# Patient Record
Sex: Female | Born: 1985 | Hispanic: No | Marital: Married | Smoking: Never smoker
Health system: Southern US, Community
[De-identification: ages and names within clinical notes are randomized; demographics above are authoritative.]

## PROBLEM LIST (undated history)

## (undated) DIAGNOSIS — Z789 Other specified health status: Secondary | ICD-10-CM

## (undated) DIAGNOSIS — O321XX0 Maternal care for breech presentation, not applicable or unspecified: Secondary | ICD-10-CM

## (undated) DIAGNOSIS — O320XX0 Maternal care for unstable lie, not applicable or unspecified: Secondary | ICD-10-CM

## (undated) DIAGNOSIS — J309 Allergic rhinitis, unspecified: Secondary | ICD-10-CM

## (undated) DIAGNOSIS — Z973 Presence of spectacles and contact lenses: Secondary | ICD-10-CM

## (undated) HISTORY — PX: TONSILLECTOMY: SUR1361

## (undated) HISTORY — PX: MOUTH SURGERY: SHX715

---

## 2010-12-08 ENCOUNTER — Other Ambulatory Visit (HOSPITAL_COMMUNITY): Payer: Self-pay | Admitting: Obstetrics and Gynecology

## 2010-12-08 DIAGNOSIS — O2690 Pregnancy related conditions, unspecified, unspecified trimester: Secondary | ICD-10-CM

## 2010-12-11 ENCOUNTER — Other Ambulatory Visit (HOSPITAL_COMMUNITY): Payer: Self-pay

## 2010-12-11 ENCOUNTER — Other Ambulatory Visit (HOSPITAL_COMMUNITY): Payer: Self-pay | Admitting: Obstetrics and Gynecology

## 2010-12-11 ENCOUNTER — Encounter (HOSPITAL_COMMUNITY): Payer: Self-pay

## 2010-12-11 ENCOUNTER — Ambulatory Visit (HOSPITAL_COMMUNITY)
Admission: RE | Admit: 2010-12-11 | Discharge: 2010-12-11 | Disposition: A | Discharge status: Home / Self Care | Payer: Commercial Managed Care - PPO | Source: Ambulatory Visit | Attending: Obstetrics and Gynecology | Admitting: Obstetrics and Gynecology

## 2010-12-11 DIAGNOSIS — O368390 Maternal care for abnormalities of the fetal heart rate or rhythm, unspecified trimester, not applicable or unspecified: Secondary | ICD-10-CM

## 2010-12-11 DIAGNOSIS — Z3689 Encounter for other specified antenatal screening: Secondary | ICD-10-CM | POA: Insufficient documentation

## 2010-12-11 DIAGNOSIS — O2690 Pregnancy related conditions, unspecified, unspecified trimester: Secondary | ICD-10-CM

## 2011-01-06 ENCOUNTER — Inpatient Hospital Stay (HOSPITAL_COMMUNITY): Admission: AD | Admit: 2011-01-06 | Payer: Self-pay | Source: Home / Self Care | Admitting: Family Medicine

## 2011-01-08 ENCOUNTER — Ambulatory Visit (HOSPITAL_COMMUNITY): Payer: Commercial Managed Care - PPO

## 2011-03-01 ENCOUNTER — Inpatient Hospital Stay (HOSPITAL_COMMUNITY)
Admission: AD | Admit: 2011-03-01 | Discharge: 2011-03-04 | DRG: 775 | Disposition: A | Discharge status: Home / Self Care | Payer: Commercial Managed Care - PPO | Source: Ambulatory Visit | Attending: Obstetrics and Gynecology | Admitting: Obstetrics and Gynecology

## 2011-03-01 DIAGNOSIS — O409XX0 Polyhydramnios, unspecified trimester, not applicable or unspecified: Principal | ICD-10-CM | POA: Diagnosis present

## 2011-03-01 DIAGNOSIS — D62 Acute posthemorrhagic anemia: Secondary | ICD-10-CM | POA: Diagnosis not present

## 2011-03-01 DIAGNOSIS — O9903 Anemia complicating the puerperium: Secondary | ICD-10-CM | POA: Diagnosis not present

## 2011-03-01 LAB — CBC
HCT: 34.4 % — ABNORMAL LOW (ref 36.0–46.0)
Hemoglobin: 11.5 g/dL — ABNORMAL LOW (ref 12.0–15.0)
MCH: 29.8 pg (ref 26.0–34.0)
MCHC: 33.4 g/dL (ref 30.0–36.0)
MCV: 89.1 fL (ref 78.0–100.0)

## 2011-03-03 LAB — CBC
HCT: 29.2 % — ABNORMAL LOW (ref 36.0–46.0)
MCHC: 33.6 g/dL (ref 30.0–36.0)
MCV: 89 fL (ref 78.0–100.0)
RDW: 14.1 % (ref 11.5–15.5)
WBC: 13.9 10*3/uL — ABNORMAL HIGH (ref 4.0–10.5)

## 2011-03-13 NOTE — H&P (Signed)
  NAMEERIAL, FIKES NO.:  [id number]  MEDICAL RECORD NO.:  [id number]  LOCATION:  9132                          FACILITY:  [facility]  PHYSICIAN:  Lenoard Aden, M.D.DATE OF BIRTH:  Jan 10, 1986  DATE OF ADMISSION:  03/01/2011 DATE OF DISCHARGE:                             HISTORY & PHYSICAL   CHIEF COMPLAINT:  Polyhydramnios, attempt for induction.  She is a 25 year old white female G1, P0 at 39 weeks for cervical ripening and induction due to persistent polyhydramnios.  She is a nonsmoker, nondrinker.  She denies domestic or physical violence.  MEDICATIONS:  Include prenatal vitamins.  She had noncontributory social history or pregnancy history.  She has a family history which is also noncontributory.  PRENATAL COURSE:  Complicated by an abnormal ultrasound at 27 weeks which revealed a fetal arrhythmia.  Maternal fetal ultrasound was ordered and persistent polyhydramnios was noted with AFIs ranging from 30 to 32 with weekly ample surveillance.  Recommendation was made for delivery at 39 weeks.  PHYSICAL EXAMINATION:  GENERAL:  She is a well-developed, well-nourished white female, in no acute distress. HEENT:  Normal. NECK:  Supple.  Full range of motion. LUNGS:  Clear. HEART:  Regular rhythm. ABDOMEN:  Soft, gravid, and nontender.  Estimated fetal weight 8 pounds. Cervix is 2-3 cm, 70% vertex, -1. EXTREMITIES:  There are no cords. NEUROLOGIC:  Nonfocal. SKIN:  Intact.  IMPRESSION: 1. A 39-week intrauterine pregnancy. 2. Persistent mild-to-moderate polyhydramnios.  PLAN:  Proceed with induction.  Risks, benefits discussed.     Lenoard Aden, M.D.     RJT/MEDQ  D:  03/02/2011  T:  03/02/2011  Job:  [id number]  Electronically Signed by Olivia Mackie M.D. on 03/13/2011 07:36:25 AM

## 2011-06-23 ENCOUNTER — Encounter (HOSPITAL_COMMUNITY): Payer: Self-pay | Admitting: *Deleted

## 2012-09-24 IMAGING — US US OB DETAIL+14 WK
1 series · 14 of 28 positions shown · non-contrast
Comparison: none

[Series 1: us ob detail+14 wk · 0.23mm/px · 14 of 52 slices shown]
[im 2/52]
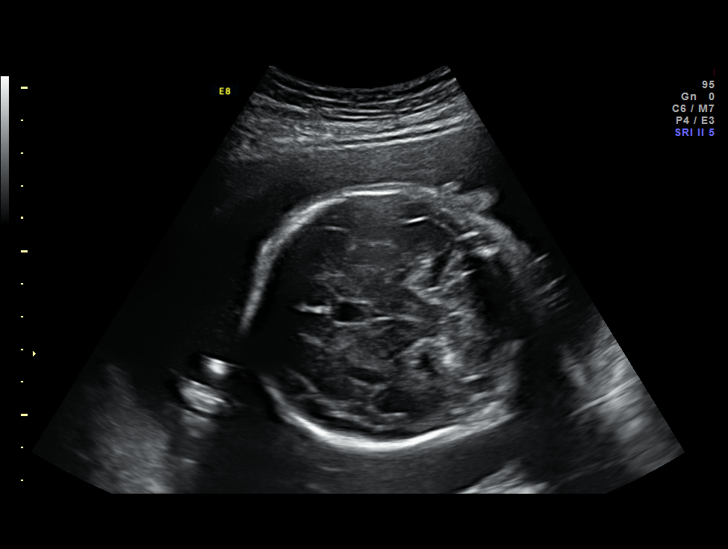
[im 6/52]
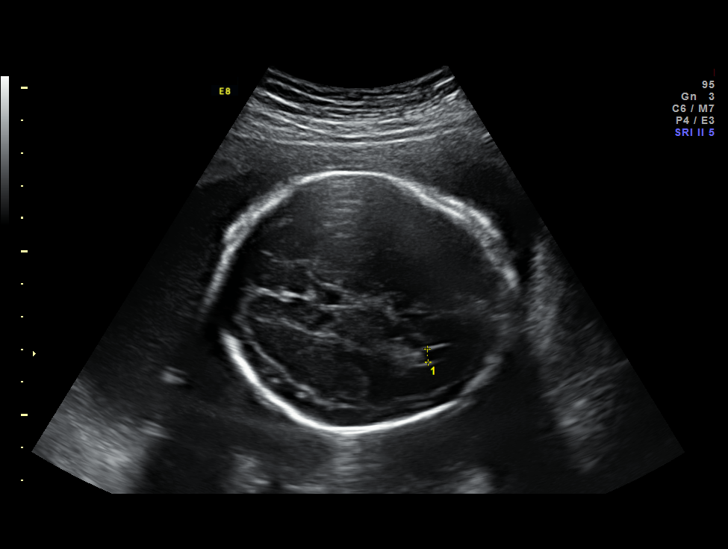
[im 10/52]
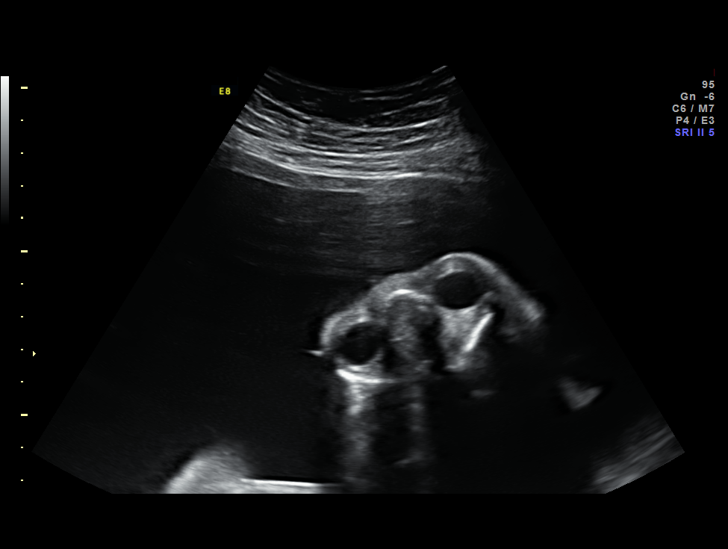
[im 14/52]
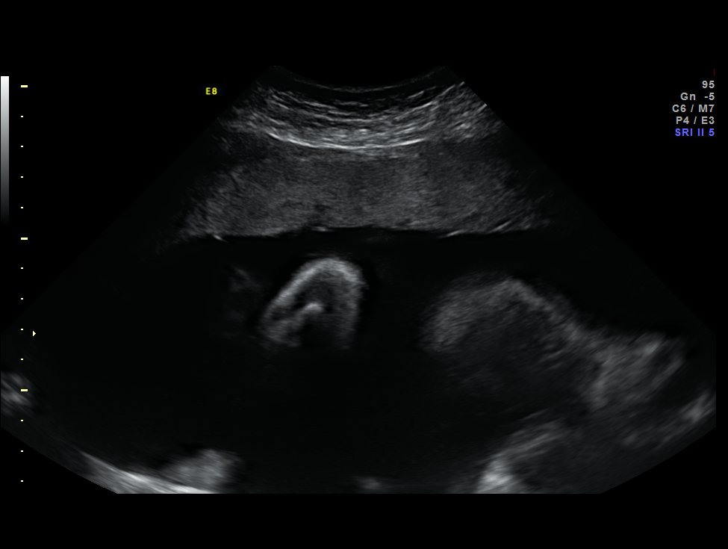
[im 18/52]
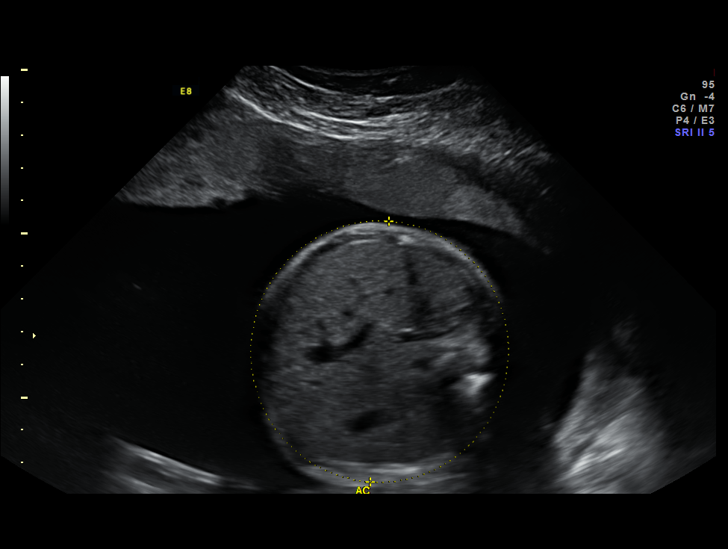
[im 21/52]
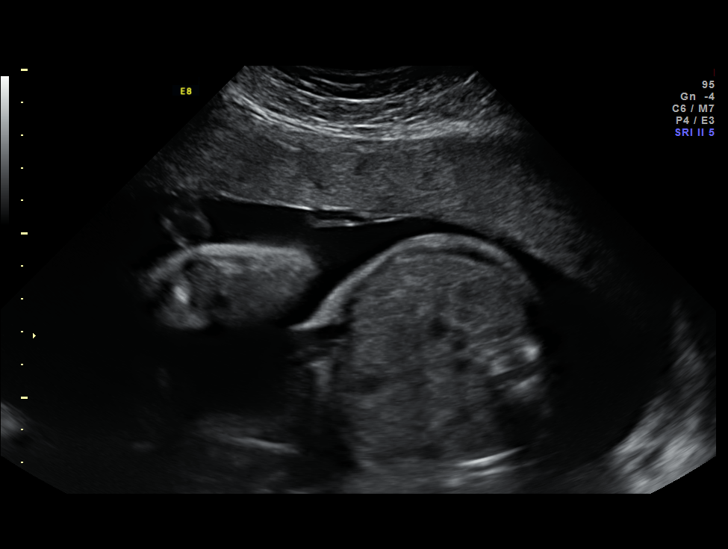
[im 25/52]
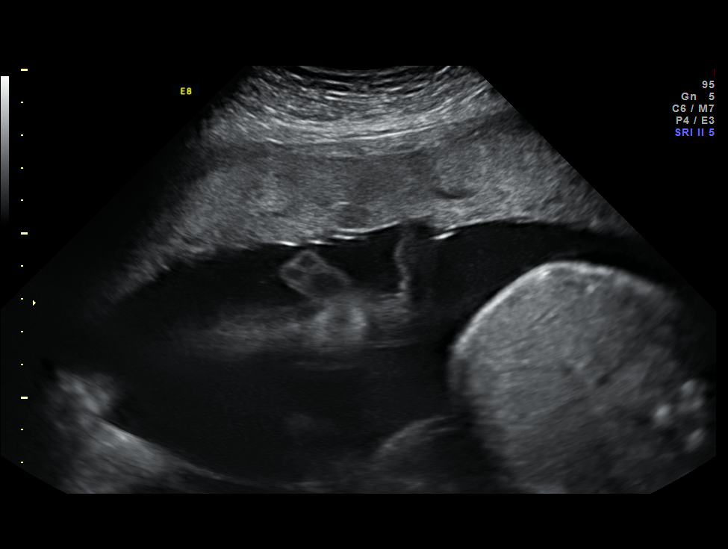
[im 29/52]
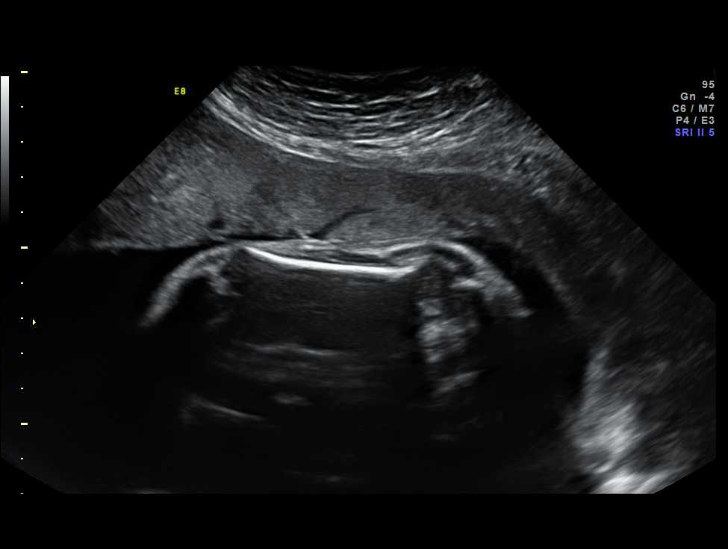
[im 33/52]
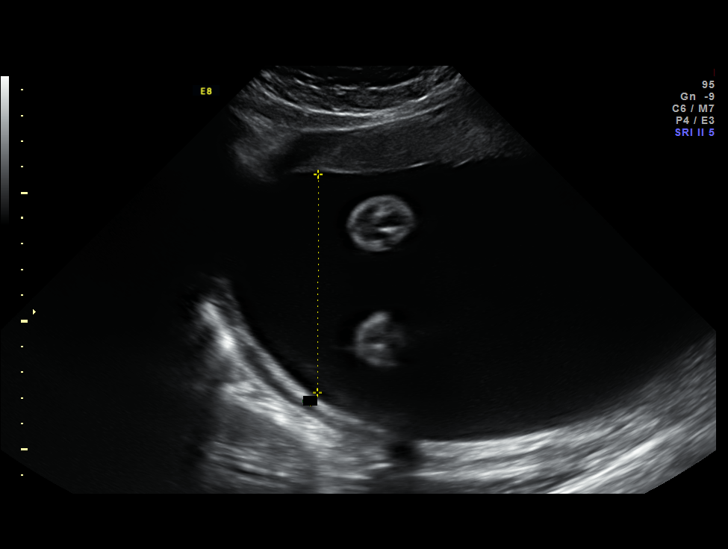
[im 36/52]
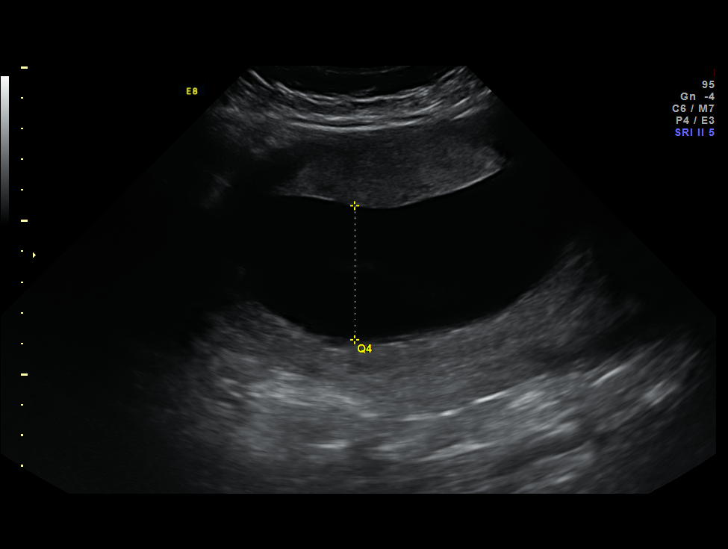
[im 40/52]
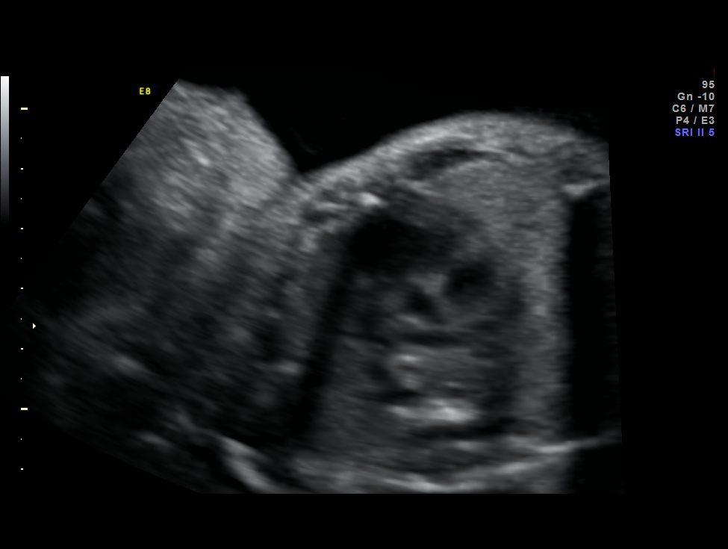
[im 44/52]
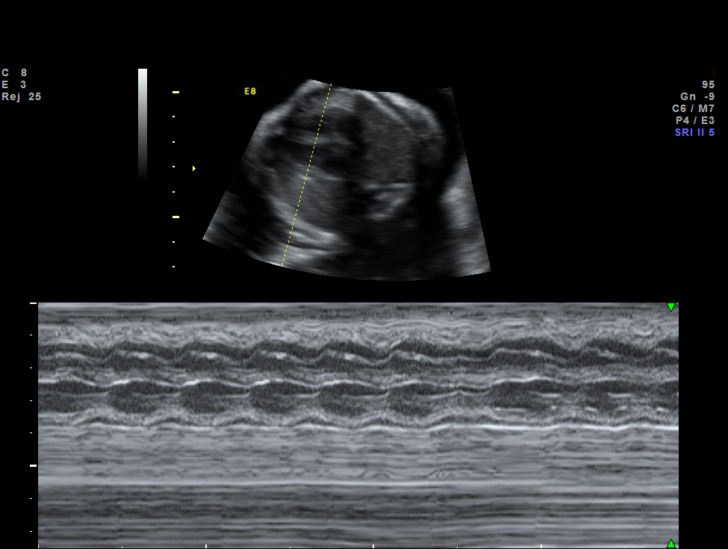
[im 48/52]
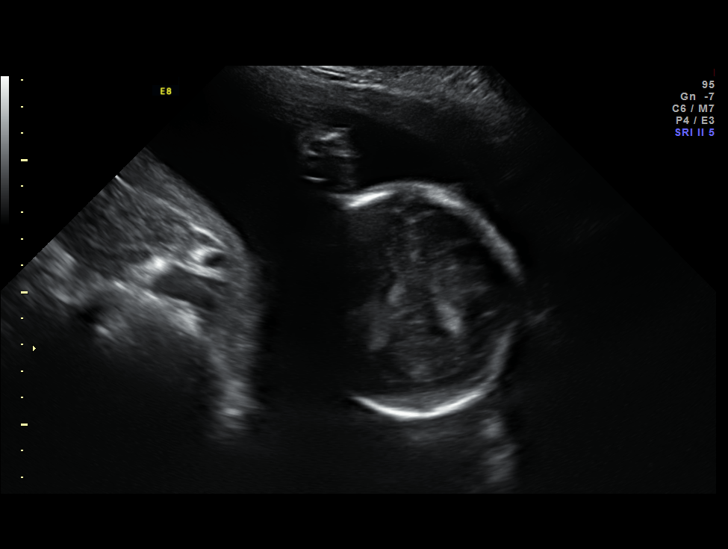
[im 52/52]
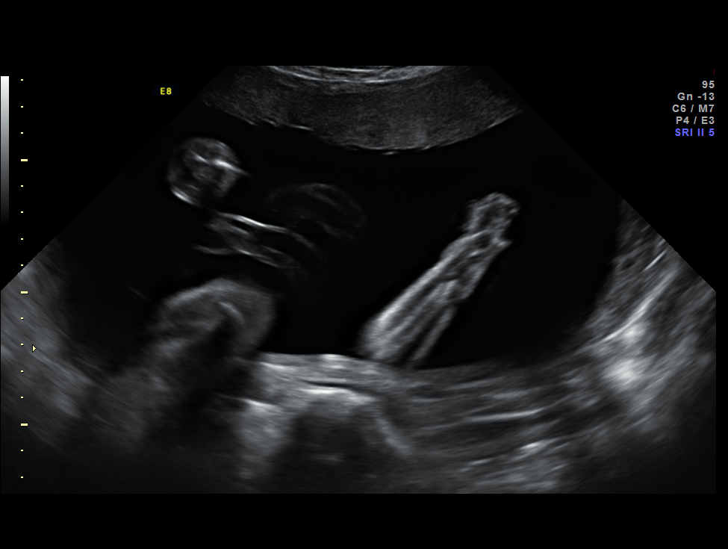

[14 of 28 positions shown; findings below may reference images not displayed]

Canned report from images found in remote index.

Refer to host system for actual result text.

## 2014-07-09 ENCOUNTER — Encounter (HOSPITAL_COMMUNITY): Payer: Self-pay | Admitting: *Deleted

## 2014-09-13 ENCOUNTER — Other Ambulatory Visit: Payer: Self-pay | Admitting: Obstetrics and Gynecology

## 2014-09-13 NOTE — H&P (Signed)
NAMTimoteo Roberta:  [name], [name]             ACCOUNT NO.:  [id number]  MEDICAL RECORD NO.:  [id number]  LOCATION:  PERIO                         FACILITY:  [facility]  PHYSICIAN:  Lenoard Adenichard J. Jameson Morrow, M.D.DATE OF BIRTH:  [birth year]  DATE OF ADMISSION:  09/14/2014 DATE OF DISCHARGE:                             HISTORY & PHYSICAL   CHIEF COMPLAINT:  Missed abortion.  HISTORY OF PRESENT ILLNESS:  This is a 29 year old white female, G2, P1 at [redacted] weeks gestation, 14 weeks LMP with missed abortion.  family history of migraine, personal history of vaginal delivery x1.  SURGICAL HISTORY:  Unremarkable.  PHYSICAL EXAMINATION:  GENERAL:  Well-developed, well-nourished white female, in no acute distress. HEENT:  Normal. NECK:  Supple.  Full range of motion. LUNGS:  Clear. HEART:  Regular rate and rhythm. ABDOMEN:  Soft, nontender.  Reveals an 8-10 week sized uterus with no adnexal masses. EXTREMITIES:  Revealed no cords. NEUROLOGIC:  Nonfocal. SKIN:  Intact.  IMPRESSION:  Missed abortion at [redacted] weeks gestation.  PLAN:  Proceed with suction D and E.  Risks of anesthesia, infection, bleeding, injury to surrounding organs with need for repair were discussed.  Delayed versus immediate complications to include bowel and bladder injury noted.  The patient acknowledges and wishes to proceed.     Lenoard Adenichard J. [name], M.D.     RJT/MEDQ  D:  09/13/2014  T:  09/13/2014  Job:  [id number]

## 2014-09-14 ENCOUNTER — Ambulatory Visit (HOSPITAL_COMMUNITY)
Admission: RE | Admit: 2014-09-14 | Discharge: 2014-09-14 | Disposition: A | Discharge status: Home / Self Care | Payer: Commercial Managed Care - PPO | Source: Ambulatory Visit | Attending: Obstetrics and Gynecology | Admitting: Obstetrics and Gynecology

## 2014-09-14 ENCOUNTER — Ambulatory Visit (HOSPITAL_COMMUNITY): Payer: Commercial Managed Care - PPO | Admitting: Anesthesiology

## 2014-09-14 ENCOUNTER — Encounter (HOSPITAL_COMMUNITY): Payer: Self-pay | Admitting: *Deleted

## 2014-09-14 ENCOUNTER — Encounter (HOSPITAL_COMMUNITY)
Admission: RE | Disposition: A | Discharge status: Home / Self Care | Payer: Self-pay | Source: Ambulatory Visit | Attending: Obstetrics and Gynecology

## 2014-09-14 DIAGNOSIS — O021 Missed abortion: Secondary | ICD-10-CM | POA: Diagnosis present

## 2014-09-14 HISTORY — PX: DILATION AND EVACUATION: SHX1459

## 2014-09-14 LAB — CBC
HCT: 39.4 % (ref 36.0–46.0)
Hemoglobin: 13.4 g/dL (ref 12.0–15.0)
MCH: 30 pg (ref 26.0–34.0)
MCHC: 34 g/dL (ref 30.0–36.0)
MCV: 88.1 fL (ref 78.0–100.0)
PLATELETS: 257 10*3/uL (ref 150–400)
RBC: 4.47 MIL/uL (ref 3.87–5.11)
RDW: 12.6 % (ref 11.5–15.5)
WBC: 7.5 10*3/uL (ref 4.0–10.5)

## 2014-09-14 LAB — ABO/RH: ABO/RH(D): A POS

## 2014-09-14 SURGERY — DILATION AND EVACUATION, UTERUS
Anesthesia: Monitor Anesthesia Care | Site: Vagina

## 2014-09-14 MED ORDER — ONDANSETRON HCL 4 MG/2ML IJ SOLN
INTRAMUSCULAR | Status: DC | PRN
  Administered 2014-09-14: 4 mg via INTRAVENOUS

## 2014-09-14 MED ORDER — KETOROLAC TROMETHAMINE 30 MG/ML IJ SOLN
INTRAMUSCULAR | Status: DC | PRN
  Administered 2014-09-14: 30 mg via INTRAVENOUS

## 2014-09-14 MED ORDER — CEFAZOLIN SODIUM-DEXTROSE 2-3 GM-% IV SOLR
INTRAVENOUS | Status: AC
  Filled 2014-09-14: qty 50

## 2014-09-14 MED ORDER — LIDOCAINE HCL (CARDIAC) 20 MG/ML IV SOLN
INTRAVENOUS | Status: AC
  Filled 2014-09-14: qty 5

## 2014-09-14 MED ORDER — CEFAZOLIN SODIUM-DEXTROSE 2-3 GM-% IV SOLR
2.0000 g | INTRAVENOUS | Status: AC
  Administered 2014-09-14: 2 g via INTRAVENOUS

## 2014-09-14 MED ORDER — PROPOFOL 10 MG/ML IV BOLUS
INTRAVENOUS | Status: AC
  Filled 2014-09-14: qty 20

## 2014-09-14 MED ORDER — PROPOFOL INFUSION 10 MG/ML OPTIME: INTRAVENOUS | Status: DC | PRN

## 2014-09-14 MED ORDER — TRAMADOL HCL 50 MG PO TABS: 50.0000 mg | ORAL_TABLET | Freq: Four times a day (QID) | ORAL | Status: DC | PRN

## 2014-09-14 MED ORDER — BUPIVACAINE HCL (PF) 0.25 % IJ SOLN
INTRAMUSCULAR | Status: AC
  Filled 2014-09-14: qty 30

## 2014-09-14 MED ORDER — FENTANYL CITRATE 0.05 MG/ML IJ SOLN
INTRAMUSCULAR | Status: AC
  Filled 2014-09-14: qty 2

## 2014-09-14 MED ORDER — BUPIVACAINE HCL (PF) 0.25 % IJ SOLN
INTRAMUSCULAR | Status: DC | PRN
  Administered 2014-09-14: 20 mL

## 2014-09-14 MED ORDER — SCOPOLAMINE 1 MG/3DAYS TD PT72
1.0000 | MEDICATED_PATCH | TRANSDERMAL | Status: DC
  Administered 2014-09-14: 1.5 mg via TRANSDERMAL

## 2014-09-14 MED ORDER — LACTATED RINGERS IV SOLN
INTRAVENOUS | Status: DC
  Administered 2014-09-14 (×2): via INTRAVENOUS

## 2014-09-14 MED ORDER — LIDOCAINE HCL (CARDIAC) 20 MG/ML IV SOLN
INTRAVENOUS | Status: DC | PRN
  Administered 2014-09-14: 30 mg via INTRAVENOUS

## 2014-09-14 MED ORDER — PROPOFOL 10 MG/ML IV EMUL
INTRAVENOUS | Status: DC | PRN
  Administered 2014-09-14 (×6): 20 mg via INTRAVENOUS
  Administered 2014-09-14: 40 mg via INTRAVENOUS
  Administered 2014-09-14 (×3): 20 mg via INTRAVENOUS

## 2014-09-14 MED ORDER — MIDAZOLAM HCL 2 MG/2ML IJ SOLN
INTRAMUSCULAR | Status: AC
  Filled 2014-09-14: qty 2

## 2014-09-14 MED ORDER — MIDAZOLAM HCL 2 MG/2ML IJ SOLN
INTRAMUSCULAR | Status: DC | PRN
  Administered 2014-09-14: 2 mg via INTRAVENOUS

## 2014-09-14 MED ORDER — ONDANSETRON HCL 4 MG/2ML IJ SOLN
INTRAMUSCULAR | Status: AC
  Filled 2014-09-14: qty 2

## 2014-09-14 MED ORDER — FENTANYL CITRATE 0.05 MG/ML IJ SOLN
INTRAMUSCULAR | Status: AC
  Administered 2014-09-14: 25 ug via INTRAVENOUS
  Filled 2014-09-14: qty 2

## 2014-09-14 MED ORDER — FENTANYL CITRATE 0.05 MG/ML IJ SOLN
25.0000 ug | INTRAMUSCULAR | Status: DC | PRN
  Administered 2014-09-14: 25 ug via INTRAVENOUS

## 2014-09-14 MED ORDER — FENTANYL CITRATE 0.05 MG/ML IJ SOLN
INTRAMUSCULAR | Status: DC | PRN
  Administered 2014-09-14 (×2): 50 ug via INTRAVENOUS

## 2014-09-14 MED ORDER — DEXAMETHASONE SODIUM PHOSPHATE 4 MG/ML IJ SOLN
INTRAMUSCULAR | Status: AC
  Filled 2014-09-14: qty 1

## 2014-09-14 MED ORDER — SCOPOLAMINE 1 MG/3DAYS TD PT72
MEDICATED_PATCH | TRANSDERMAL | Status: AC
  Filled 2014-09-14: qty 1

## 2014-09-14 MED ORDER — KETOROLAC TROMETHAMINE 30 MG/ML IJ SOLN
INTRAMUSCULAR | Status: AC
  Filled 2014-09-14: qty 1

## 2014-09-14 MED ORDER — DEXAMETHASONE SODIUM PHOSPHATE 10 MG/ML IJ SOLN
INTRAMUSCULAR | Status: DC | PRN
  Administered 2014-09-14: 4 mg via INTRAVENOUS

## 2014-09-14 SURGICAL SUPPLY — 16 items
CATH ROBINSON RED A/P 16FR (CATHETERS) ×3 IMPLANT
CLOTH BEACON ORANGE TIMEOUT ST (SAFETY) ×3 IMPLANT
DECANTER SPIKE VIAL GLASS SM (MISCELLANEOUS) ×3 IMPLANT
GLOVE BIO SURGEON STRL SZ7.5 (GLOVE) ×3 IMPLANT
GOWN STRL REUS W/TWL LRG LVL3 (GOWN DISPOSABLE) ×6 IMPLANT
KIT BERKELEY 1ST TRIMESTER 3/8 (MISCELLANEOUS) ×3 IMPLANT
NS IRRIG 1000ML POUR BTL (IV SOLUTION) ×3 IMPLANT
PACK VAGINAL MINOR WOMEN LF (CUSTOM PROCEDURE TRAY) ×3 IMPLANT
PAD OB MATERNITY 4.3X12.25 (PERSONAL CARE ITEMS) ×3 IMPLANT
PAD PREP 24X48 CUFFED NSTRL (MISCELLANEOUS) ×3 IMPLANT
SET BERKELEY SUCTION TUBING (SUCTIONS) ×3 IMPLANT
TOWEL OR 17X24 6PK STRL BLUE (TOWEL DISPOSABLE) ×6 IMPLANT
VACURETTE 10 RIGID CVD (CANNULA) IMPLANT
VACURETTE 7MM CVD STRL WRAP (CANNULA) IMPLANT
VACURETTE 8 RIGID CVD (CANNULA) IMPLANT
VACURETTE 9 RIGID CVD (CANNULA) ×3 IMPLANT

## 2014-09-14 NOTE — Op Note (Signed)
[name], Roberta             ACCOUNT NO.:  [id number]  MEDICAL RECORD NO.:  [id number]  LOCATION:  [facility]                          [facility]ILITY:  [facility]  PHYSICIAN:  Lenoard Adenichard J. Alvie Speltz, M.D.DATE OF BIRTH:  1985-11-02  DATE OF PROCEDURE: DATE OF DISCHARGE:                              OPERATIVE REPORT   PREOPERATIVE DIAGNOSIS:  Missed abortion.  POSTOPERATIVE DIAGNOSIS:  Missed abortion.  PROCEDURE:  Suction D and E.  SURGEON:  Lenoard Adenichard J. Cordaro Mukai, M.D.  ASSISTANT:  None.  ANESTHESIA:  IV sedation and local.  ESTIMATED BLOOD LOSS:  Less than 50 mL.  COMPLICATIONS:  None.  DRAINS:  None.  COUNTS:  Correct.  DISPOSITION:  The patient to Recovery in good condition.  BRIEF OPERATIVE NOTE:  After being apprised of risks of anesthesia, infection, bleeding, uterine perforation, possible need for repair, delayed versus immediate complications to include bowel and bladder injury, possible need for repair, the patient was brought to the operating room, where she was administered IV sedation without difficulty, prepped and draped in the usual sterile fashion, catheterized to the bladder to empty it.  Exam under anesthesia revealed a 10 week size anteflexed uterus and no adnexal masses.  A dilute Marcaine solution was used to place paracervical block in a standard fashion, 20 mL total.  Cervix was easily dilated up to a #27 Pratt dilator.  A 9 mm curved suction curette was placed.  Aspiration reveals products of conception which were aspirated without difficulty.  Good hemostasis was noted.  Curettage in a 4-quadrant method revealed the cavity to be empty.  Repeat suction and curettage was confirmed.  The patient tolerated the procedure well.  Good hemostasis was noted.  She was transferred to Recovery in good condition.     Lenoard Adenichard J. Zacchary Pompei, M.D.     RJT/MEDQ  D:  09/14/2014  T:  09/14/2014  Job:  [id number]

## 2014-09-14 NOTE — Discharge Instructions (Signed)

## 2014-09-14 NOTE — Progress Notes (Signed)
Patient ID: Roberta StampsLisbeth [name], female   DOB: 1986-01-15, 29 y.o.   MRN: [id number]

## 2014-09-14 NOTE — Anesthesia Postprocedure Evaluation (Signed)
  Anesthesia Post-op Note  Patient: [name]  Procedure(s) Performed: Procedure(s): DILATATION AND EVACUATION (N/A) Patient is awake and responsive. Pain and nausea are reasonably well controlled. Vital signs are stable and clinically acceptable. Oxygen saturation is clinically acceptable. There are no apparent anesthetic complications at this time. Patient is ready for discharge.

## 2014-09-14 NOTE — Anesthesia Preprocedure Evaluation (Signed)
Anesthesia Evaluation  Patient identified by MRN, date of birth, ID band Patient awake    Reviewed: Allergy & Precautions, H&P , Patient's Chart, lab work & pertinent test results, reviewed documented beta blocker date and time   History of Anesthesia Complications Negative for: history of anesthetic complications  Airway Mallampati: II TM Distance: >3 FB Neck ROM: full    Dental   Pulmonary  breath sounds clear to auscultation        Cardiovascular Exercise Tolerance: Good Rhythm:regular Rate:Normal     Neuro/Psych negative psych ROS   GI/Hepatic   Endo/Other    Renal/GU      Musculoskeletal   Abdominal   Peds  Hematology   Anesthesia Other Findings   Reproductive/Obstetrics                           Anesthesia Physical Anesthesia Plan  ASA: II  Anesthesia Plan: MAC   Post-op Pain Management:    Induction:   Airway Management Planned:   Additional Equipment:   Intra-op Plan:   Post-operative Plan:   Informed Consent: I have reviewed the patients History and Physical, chart, labs and discussed the procedure including the risks, benefits and alternatives for the proposed anesthesia with the patient or authorized representative who has indicated his/her understanding and acceptance.   Dental Advisory Given  Plan Discussed with: CRNA, Surgeon and Anesthesiologist  Anesthesia Plan Comments:         Anesthesia Quick Evaluation  

## 2014-09-14 NOTE — Transfer of Care (Signed)
Immediate Anesthesia Transfer of Care Note  Patient: [name]  Procedure(s) Performed: Procedure(s): DILATATION AND EVACUATION (N/A)  Patient Location: PACU  Anesthesia Type:MAC  Level of Consciousness: awake and alert   Airway & Oxygen Therapy: Patient Spontanous Breathing  Post-op Assessment: Report given to PACU RN and Post -op Vital signs reviewed and stable  Post vital signs: Reviewed and stable  Complications: No apparent anesthesia complications

## 2014-09-14 NOTE — Op Note (Signed)
09/14/2014  8:14 AM  PATIENT:  [name]  29 y.o. female  PRE-OPERATIVE DIAGNOSIS:  Missed Abortion  POST-OPERATIVE DIAGNOSIS:  Missed Abortion  PROCEDURE:  Procedure(s): DILATATION AND EVACUATION  SURGEON:  Surgeon(s): Lenoard Adenichard J Cahlil Sattar, MD  ASSISTANTS: none   ANESTHESIA:   local and IV sedation  ESTIMATED BLOOD LOSS: minimal  DRAINS: none   LOCAL MEDICATIONS USED:  MARCAINE    and Amount: 20 ml  SPECIMEN:  Source of Specimen:  poc  DISPOSITION OF SPECIMEN:  PATHOLOGY  COUNTS:  YES  DICTATION #: [id number]  PLAN OF CARE: dc home  PATIENT DISPOSITION:  PACU - hemodynamically stable.

## 2014-09-17 ENCOUNTER — Encounter (HOSPITAL_COMMUNITY): Payer: Self-pay | Admitting: Obstetrics and Gynecology

## 2015-02-15 ENCOUNTER — Encounter (HOSPITAL_COMMUNITY): Payer: Self-pay | Admitting: General Practice

## 2015-02-19 ENCOUNTER — Ambulatory Visit (HOSPITAL_COMMUNITY): Payer: Commercial Managed Care - PPO | Admitting: Anesthesiology

## 2015-02-19 ENCOUNTER — Encounter (HOSPITAL_COMMUNITY): Payer: Self-pay | Admitting: Anesthesiology

## 2015-02-19 ENCOUNTER — Encounter (HOSPITAL_COMMUNITY)
Admission: RE | Disposition: A | Discharge status: Home / Self Care | Payer: Self-pay | Source: Ambulatory Visit | Attending: Obstetrics and Gynecology

## 2015-02-19 ENCOUNTER — Ambulatory Visit (HOSPITAL_COMMUNITY)
Admission: RE | Admit: 2015-02-19 | Discharge: 2015-02-19 | Disposition: A | Discharge status: Home / Self Care | Payer: Commercial Managed Care - PPO | Source: Ambulatory Visit | Attending: Obstetrics and Gynecology | Admitting: Obstetrics and Gynecology

## 2015-02-19 ENCOUNTER — Other Ambulatory Visit: Payer: Self-pay | Admitting: Obstetrics and Gynecology

## 2015-02-19 DIAGNOSIS — O021 Missed abortion: Secondary | ICD-10-CM | POA: Insufficient documentation

## 2015-02-19 DIAGNOSIS — Z79899 Other long term (current) drug therapy: Secondary | ICD-10-CM | POA: Insufficient documentation

## 2015-02-19 DIAGNOSIS — Z791 Long term (current) use of non-steroidal anti-inflammatories (NSAID): Secondary | ICD-10-CM | POA: Diagnosis not present

## 2015-02-19 DIAGNOSIS — Z3A00 Weeks of gestation of pregnancy not specified: Secondary | ICD-10-CM | POA: Insufficient documentation

## 2015-02-19 HISTORY — PX: DILATION AND EVACUATION: SHX1459

## 2015-02-19 SURGERY — DILATION AND EVACUATION, UTERUS
Anesthesia: Monitor Anesthesia Care

## 2015-02-19 MED ORDER — ONDANSETRON HCL 4 MG/2ML IJ SOLN: 4.0000 mg | Freq: Once | INTRAMUSCULAR | Status: DC | PRN

## 2015-02-19 MED ORDER — HYDROCODONE-IBUPROFEN 7.5-200 MG PO TABS: 1.0000 | ORAL_TABLET | Freq: Three times a day (TID) | ORAL | Status: DC | PRN

## 2015-02-19 MED ORDER — BUPIVACAINE HCL (PF) 0.25 % IJ SOLN
INTRAMUSCULAR | Status: AC
  Filled 2015-02-19: qty 30

## 2015-02-19 MED ORDER — LACTATED RINGERS IV SOLN
INTRAVENOUS | Status: DC
  Administered 2015-02-19 (×2): via INTRAVENOUS

## 2015-02-19 MED ORDER — MIDAZOLAM HCL 2 MG/2ML IJ SOLN
INTRAMUSCULAR | Status: AC
  Filled 2015-02-19: qty 2

## 2015-02-19 MED ORDER — LIDOCAINE HCL (CARDIAC) 20 MG/ML IV SOLN
INTRAVENOUS | Status: DC | PRN
  Administered 2015-02-19: 100 mg via INTRAVENOUS

## 2015-02-19 MED ORDER — FENTANYL CITRATE (PF) 100 MCG/2ML IJ SOLN
25.0000 ug | INTRAMUSCULAR | Status: DC | PRN
  Administered 2015-02-19: 50 ug via INTRAVENOUS

## 2015-02-19 MED ORDER — DEXAMETHASONE SODIUM PHOSPHATE 10 MG/ML IJ SOLN
INTRAMUSCULAR | Status: DC | PRN
  Administered 2015-02-19: 4 mg via INTRAVENOUS

## 2015-02-19 MED ORDER — KETOROLAC TROMETHAMINE 30 MG/ML IJ SOLN
INTRAMUSCULAR | Status: DC | PRN
  Administered 2015-02-19: 30 mg via INTRAVENOUS

## 2015-02-19 MED ORDER — SCOPOLAMINE 1 MG/3DAYS TD PT72: 1.0000 | MEDICATED_PATCH | Freq: Once | TRANSDERMAL | Status: DC

## 2015-02-19 MED ORDER — PROPOFOL 10 MG/ML IV BOLUS
INTRAVENOUS | Status: AC
  Filled 2015-02-19: qty 40

## 2015-02-19 MED ORDER — PROPOFOL INFUSION 10 MG/ML OPTIME
INTRAVENOUS | Status: DC | PRN
  Administered 2015-02-19: 100 ug/kg/min via INTRAVENOUS

## 2015-02-19 MED ORDER — KETOROLAC TROMETHAMINE 30 MG/ML IJ SOLN
INTRAMUSCULAR | Status: AC
  Filled 2015-02-19: qty 1

## 2015-02-19 MED ORDER — FENTANYL CITRATE (PF) 100 MCG/2ML IJ SOLN
INTRAMUSCULAR | Status: DC | PRN
  Administered 2015-02-19: 100 ug via INTRAVENOUS

## 2015-02-19 MED ORDER — CEFAZOLIN SODIUM-DEXTROSE 2-3 GM-% IV SOLR
INTRAVENOUS | Status: AC
  Filled 2015-02-19: qty 50

## 2015-02-19 MED ORDER — MEPERIDINE HCL 25 MG/ML IJ SOLN: 6.2500 mg | INTRAMUSCULAR | Status: DC | PRN

## 2015-02-19 MED ORDER — KETOROLAC TROMETHAMINE 30 MG/ML IJ SOLN: 30.0000 mg | Freq: Once | INTRAMUSCULAR | Status: DC | PRN

## 2015-02-19 MED ORDER — LIDOCAINE HCL (CARDIAC) 20 MG/ML IV SOLN
INTRAVENOUS | Status: AC
  Filled 2015-02-19: qty 5

## 2015-02-19 MED ORDER — PROPOFOL 500 MG/50ML IV EMUL
INTRAVENOUS | Status: DC | PRN
  Administered 2015-02-19 (×3): 50 mg via INTRAVENOUS

## 2015-02-19 MED ORDER — MIDAZOLAM HCL 2 MG/2ML IJ SOLN
INTRAMUSCULAR | Status: DC | PRN
  Administered 2015-02-19: 2 mg via INTRAVENOUS

## 2015-02-19 MED ORDER — FENTANYL CITRATE (PF) 100 MCG/2ML IJ SOLN
INTRAMUSCULAR | Status: AC
  Filled 2015-02-19: qty 2

## 2015-02-19 MED ORDER — DEXAMETHASONE SODIUM PHOSPHATE 4 MG/ML IJ SOLN
INTRAMUSCULAR | Status: AC
  Filled 2015-02-19: qty 1

## 2015-02-19 MED ORDER — ONDANSETRON HCL 4 MG/2ML IJ SOLN
INTRAMUSCULAR | Status: AC
  Filled 2015-02-19: qty 2

## 2015-02-19 MED ORDER — ONDANSETRON HCL 4 MG/2ML IJ SOLN
INTRAMUSCULAR | Status: DC | PRN
  Administered 2015-02-19: 4 mg via INTRAVENOUS

## 2015-02-19 MED ORDER — BUPIVACAINE HCL (PF) 0.25 % IJ SOLN
INTRAMUSCULAR | Status: DC | PRN
  Administered 2015-02-19: 20 mL

## 2015-02-19 SURGICAL SUPPLY — 16 items
CATH ROBINSON RED A/P 16FR (CATHETERS) ×3 IMPLANT
CLOTH BEACON ORANGE TIMEOUT ST (SAFETY) ×3 IMPLANT
DECANTER SPIKE VIAL GLASS SM (MISCELLANEOUS) ×3 IMPLANT
GLOVE BIO SURGEON STRL SZ7.5 (GLOVE) ×3 IMPLANT
GOWN STRL REUS W/TWL LRG LVL3 (GOWN DISPOSABLE) ×6 IMPLANT
KIT BERKELEY 1ST TRIMESTER 3/8 (MISCELLANEOUS) ×3 IMPLANT
NS IRRIG 1000ML POUR BTL (IV SOLUTION) ×3 IMPLANT
PACK VAGINAL MINOR WOMEN LF (CUSTOM PROCEDURE TRAY) ×3 IMPLANT
PAD OB MATERNITY 4.3X12.25 (PERSONAL CARE ITEMS) ×3 IMPLANT
PAD PREP 24X48 CUFFED NSTRL (MISCELLANEOUS) ×3 IMPLANT
SET BERKELEY SUCTION TUBING (SUCTIONS) ×3 IMPLANT
TOWEL OR 17X24 6PK STRL BLUE (TOWEL DISPOSABLE) ×6 IMPLANT
VACURETTE 10 RIGID CVD (CANNULA) IMPLANT
VACURETTE 7MM CVD STRL WRAP (CANNULA) ×3 IMPLANT
VACURETTE 8 RIGID CVD (CANNULA) IMPLANT
VACURETTE 9 RIGID CVD (CANNULA) IMPLANT

## 2015-02-19 NOTE — Op Note (Signed)
02/19/2015  1:25 PM  PATIENT:  [name]  29 y.o. female  PRE-OPERATIVE DIAGNOSIS:  Missed Abortion Recurrent pregnancy loss  POST-OPERATIVE DIAGNOSIS:  Missed Abortion Recurrent Pregnancy loss  PROCEDURE:  Procedure(s): DILATATION AND EVACUATION With Tissues Sent For Chromosome Analysis  SURGEON:  Surgeon(s): Olivia Mackie, MD  ASSISTANTS: none   ANESTHESIA:   local and IV sedation  ESTIMATED BLOOD LOSS: 50cc  DRAINS: none   LOCAL MEDICATIONS USED:  MARCAINE    and Amount: 20 ml  SPECIMEN:  Source of Specimen:  poc  DISPOSITION OF SPECIMEN:  PATHOLOGY  COUNTS:  YES  DICTATION #: [id number]  PLAN OF CARE: DC home  PATIENT DISPOSITION:  PACU - hemodynamically stable.

## 2015-02-19 NOTE — H&P (Signed)
[name] is an 29 y.o. female. For D&E for MaB and recurrent pregnancy loss  Pertinent Gynecological History: Menses: flow is moderate Bleeding: na Contraception: none DES exposure: denies Blood transfusions: none Sexually transmitted diseases: no past history Previous GYN Procedures: DNC  Last mammogram: na Date: na Last pap: normal Date: 2015 OB History: G3, P1   Menstrual History: Menarche age: 64  Patient's last menstrual period was 06/03/2014 (exact date).    History reviewed. No pertinent past medical history.  Past Surgical History  Procedure Laterality Date  . Tonsillectomy    . Dilation and evacuation N/A 09/14/2014    Procedure: DILATATION AND EVACUATION;  Surgeon: Lenoard Aden, MD;  Location: [facility];  Service: Gynecology;  Laterality: N/A;    History reviewed. No pertinent family history.  Social History:  reports that she has never smoked. She has never used smokeless tobacco. She reports that she does not drink alcohol or use illicit drugs.  Allergies: No Known Allergies  Prescriptions prior to admission  Medication Sig Dispense Refill Last Dose  . ibuprofen (ADVIL,MOTRIN) 200 MG tablet Take 400 mg by mouth every 6 (six) hours as needed for headache or moderate pain.     . Prenatal Vit-Fe Fumarate-FA (PRENATAL MULTIVITAMIN) TABS tablet Take 1 tablet by mouth daily at 12 noon.       Review of Systems  Constitutional: Negative.   All other systems reviewed and are negative.   Blood pressure 108/68, pulse 73, temperature 98.4 F (36.9 C), temperature source Oral, resp. rate 20, height 5\' 4"  (1.626 m), weight 78.019 kg (172 lb), last menstrual period 06/03/2014, SpO2 100 %, unknown if currently breastfeeding. Physical Exam  Constitutional: She is oriented to person, place, and time. She appears well-developed and well-nourished.  HENT:  Head: Normocephalic.  Neck: Normal range of motion. Neck supple.  Cardiovascular: Normal rate and regular  rhythm.   Respiratory: Effort normal and breath sounds normal.  GI: Soft. Bowel sounds are normal.  Genitourinary: Vagina normal and uterus normal.  Musculoskeletal: Normal range of motion.  Neurological: She is alert and oriented to person, place, and time.  Skin: Skin is warm and dry.  Psychiatric: She has a normal mood and affect. Her behavior is normal. Thought content normal.    No results found for this or any previous visit (from the past 24 hour(s)).  No results found.  Assessment/Plan: MaB Recurrent pregnancy loss Suction D&E with tissue for chromosomes. Consent done.   Geramy Lamorte J 02/19/2015, 12:39 PM

## 2015-02-19 NOTE — Anesthesia Postprocedure Evaluation (Signed)
  Anesthesia Post-op Note  Patient: [name]  Procedure(s) Performed: Procedure(s) (LRB): DILATATION AND EVACUATION With Tissues Sent For Chromosome Analysis (N/A)  Patient Location: PACU  Anesthesia Type: General  Level of Consciousness: awake and alert   Airway and Oxygen Therapy: Patient Spontanous Breathing  Post-op Pain: mild  Post-op Assessment: Post-op Vital signs reviewed, Patient's Cardiovascular Status Stable, Respiratory Function Stable, Patent Airway and No signs of Nausea or vomiting  Last Vitals:  Filed Vitals:   02/19/15 1400  BP: 105/58  Pulse: 57  Temp:   Resp: 15    Post-op Vital Signs: stable   Complications: No apparent anesthesia complications

## 2015-02-19 NOTE — Progress Notes (Signed)
Patient ID: [name], female   DOB: [birth year], 29 y.o.   MRN: [id number] Patient seen and examined. Consent witnessed and signed. No changes noted. Update completed.

## 2015-02-19 NOTE — Transfer of Care (Signed)
Immediate Anesthesia Transfer of Care Note  Patient: [name]  Procedure(s) Performed: Procedure(s): DILATATION AND EVACUATION With Tissues Sent For Chromosome Analysis (N/A)  Patient Location: PACU  Anesthesia Type:MAC  Level of Consciousness: awake, alert  and oriented  Airway & Oxygen Therapy: Patient Spontanous Breathing and Patient connected to nasal cannula oxygen  Post-op Assessment: Report given to RN, Post -op Vital signs reviewed and stable and Patient moving all extremities  Post vital signs: Reviewed and stable  Last Vitals:  Filed Vitals:   02/19/15 1148  BP: 108/68  Pulse:   Temp:   Resp:     Complications: No apparent anesthesia complications

## 2015-02-19 NOTE — Anesthesia Preprocedure Evaluation (Signed)
Anesthesia Evaluation  Patient identified by MRN, date of birth, ID band Patient awake    Reviewed: Allergy & Precautions, H&P , NPO status , Patient's Chart, lab work & pertinent test results  Airway Mallampati: I  TM Distance: >3 FB Neck ROM: full    Dental no notable dental hx. (+) Teeth Intact   Pulmonary neg pulmonary ROS,    Pulmonary exam normal       Cardiovascular negative cardio ROS Normal cardiovascular exam    Neuro/Psych negative neurological ROS  negative psych ROS   GI/Hepatic negative GI ROS, Neg liver ROS,   Endo/Other  negative endocrine ROS  Renal/GU negative Renal ROS     Musculoskeletal   Abdominal Normal abdominal exam  (+)   Peds  Hematology negative hematology ROS (+)   Anesthesia Other Findings   Reproductive/Obstetrics negative OB ROS                             Anesthesia Physical Anesthesia Plan  ASA: I  Anesthesia Plan: MAC   Post-op Pain Management:    Induction:   Airway Management Planned:   Additional Equipment:   Intra-op Plan:   Post-operative Plan:   Informed Consent: I have reviewed the patients History and Physical, chart, labs and discussed the procedure including the risks, benefits and alternatives for the proposed anesthesia with the patient or authorized representative who has indicated his/her understanding and acceptance.     Plan Discussed with: CRNA  Anesthesia Plan Comments:         Anesthesia Quick Evaluation

## 2015-02-20 ENCOUNTER — Encounter (HOSPITAL_COMMUNITY): Payer: Self-pay | Admitting: Obstetrics and Gynecology

## 2015-02-20 NOTE — Op Note (Signed)
NAMEADYN, [name] NO.:  [id number]  MEDICAL RECORD NO.:  [id number]  LOCATION:  [facility]                          FACILITY:  [facility]  PHYSICIAN:  Lenoard Aden, M.D.DATE OF BIRTH:  1986/04/10  DATE OF PROCEDURE: DATE OF DISCHARGE:  02/19/2015                              OPERATIVE REPORT   PREOPERATIVE DIAGNOSES:  Missed abortion and recurrent pregnancy loss.  POSTOPERATIVE DIAGNOSES:  Missed abortion and recurrent pregnancy loss.  PROCEDURE:  Suction, D and E.  SURGEON:  Lenoard Aden, M.D.  ASSISTANT:  None.  ANESTHESIA:  IV sedation and local.  ESTIMATED BLOOD LOSS:  50 mL.  COMPLICATIONS:  None.  DRAINS:  None.  COUNTS:  Correct.  DISPOSITION:  The patient to recovery in good condition.  BRIEF OPERATIVE NOTE:  After being apprised of the risks of anesthesia, infection, bleeding, injury to surrounding organs, possible need for repair, delayed versus immediate complications to include bowel and bladder injury, possible need for repair, the patient was brought to the operating room where she administered IV sedation without difficulty. Prepped and draped in usual sterile fashion.  Catheterized until the bladder was empty.  Exam under anesthesia revealed the 6-8 weeks size uterus and no adnexal masses.  Cervix was then grasped using a single- tooth tenaculum after placement of bivalve speculum, dilute paracervical block placed using 20 mL of dilute Marcaine solution.  Cervix was infiltrated in the standard fashion.  Cervix was then easily dilated up to a #21 Pratt dilator, 7-mm curved suction curette was placed. Aspiration apprised, conception visualized and collected via aspiration.  Repeat suction and curettage bluntly done in a 4-quadrant method and revealed the cavity to be empty.  Good hemostasis was noted. All instruments were removed.  Tissue was collected for chromosomal analysis and FISH.  The patient tolerated the procedure well,  was awakened, and transferred to recovery in good condition.     Lenoard Aden, M.D.     RJT/MEDQ  D:  02/19/2015  T:  02/20/2015  Job:  [id number]

## 2015-03-01 LAB — CHROMOSOME STD, POC(TISSUE)-NCBH

## 2016-12-14 LAB — OB RESULTS CONSOLE ANTIBODY SCREEN: Antibody Screen: NEGATIVE

## 2016-12-14 LAB — OB RESULTS CONSOLE HIV ANTIBODY (ROUTINE TESTING): HIV: NONREACTIVE

## 2016-12-14 LAB — OB RESULTS CONSOLE ABO/RH: RH Type: POSITIVE

## 2016-12-14 LAB — OB RESULTS CONSOLE HEPATITIS B SURFACE ANTIGEN: Hepatitis B Surface Ag: NEGATIVE

## 2016-12-14 LAB — OB RESULTS CONSOLE GC/CHLAMYDIA
Chlamydia: NEGATIVE
Gonorrhea: NEGATIVE

## 2016-12-14 LAB — OB RESULTS CONSOLE RUBELLA ANTIBODY, IGM: Rubella: IMMUNE

## 2016-12-14 LAB — OB RESULTS CONSOLE RPR: RPR: NONREACTIVE

## 2017-06-01 LAB — OB RESULTS CONSOLE GBS: STREP GROUP B AG: NEGATIVE

## 2017-06-14 ENCOUNTER — Telehealth (HOSPITAL_COMMUNITY): Payer: Self-pay | Admitting: *Deleted

## 2017-06-14 ENCOUNTER — Encounter (HOSPITAL_COMMUNITY): Payer: Self-pay | Admitting: *Deleted

## 2017-06-14 NOTE — Telephone Encounter (Signed)
Preadmission screen  

## 2017-06-15 ENCOUNTER — Other Ambulatory Visit: Payer: Self-pay | Admitting: Obstetrics & Gynecology

## 2017-06-17 ENCOUNTER — Telehealth (HOSPITAL_COMMUNITY): Payer: Self-pay | Admitting: *Deleted

## 2017-06-17 ENCOUNTER — Encounter (HOSPITAL_COMMUNITY): Payer: Self-pay | Admitting: *Deleted

## 2017-06-17 NOTE — Telephone Encounter (Signed)
Preadmission screen  

## 2017-06-28 ENCOUNTER — Encounter (HOSPITAL_COMMUNITY)
Admission: RE | Disposition: A | Discharge status: Home / Self Care | Payer: Self-pay | Source: Ambulatory Visit | Attending: Obstetrics & Gynecology

## 2017-06-28 ENCOUNTER — Encounter (HOSPITAL_COMMUNITY): Payer: Self-pay

## 2017-06-28 ENCOUNTER — Inpatient Hospital Stay (HOSPITAL_COMMUNITY): Payer: Commercial Managed Care - PPO | Admitting: Anesthesiology

## 2017-06-28 ENCOUNTER — Inpatient Hospital Stay (HOSPITAL_COMMUNITY)
Admission: RE | Admit: 2017-06-28 | Discharge: 2017-07-01 | DRG: 788 | Disposition: A | Discharge status: Home / Self Care | Payer: Commercial Managed Care - PPO | Source: Ambulatory Visit | Attending: Obstetrics & Gynecology | Admitting: Obstetrics & Gynecology

## 2017-06-28 DIAGNOSIS — O320XX0 Maternal care for unstable lie, not applicable or unspecified: Secondary | ICD-10-CM | POA: Diagnosis not present

## 2017-06-28 DIAGNOSIS — O321XX0 Maternal care for breech presentation, not applicable or unspecified: Secondary | ICD-10-CM | POA: Diagnosis not present

## 2017-06-28 DIAGNOSIS — Z3490 Encounter for supervision of normal pregnancy, unspecified, unspecified trimester: Secondary | ICD-10-CM | POA: Diagnosis present

## 2017-06-28 DIAGNOSIS — R339 Retention of urine, unspecified: Secondary | ICD-10-CM | POA: Diagnosis not present

## 2017-06-28 DIAGNOSIS — Z3A39 39 weeks gestation of pregnancy: Secondary | ICD-10-CM | POA: Diagnosis not present

## 2017-06-28 DIAGNOSIS — O403XX0 Polyhydramnios, third trimester, not applicable or unspecified: Principal | ICD-10-CM | POA: Diagnosis present

## 2017-06-28 DIAGNOSIS — O3663X0 Maternal care for excessive fetal growth, third trimester, not applicable or unspecified: Secondary | ICD-10-CM | POA: Diagnosis present

## 2017-06-28 HISTORY — DX: Maternal care for breech presentation, not applicable or unspecified: O32.1XX0

## 2017-06-28 HISTORY — DX: Other specified health status: Z78.9

## 2017-06-28 HISTORY — DX: Maternal care for unstable lie, not applicable or unspecified: O32.0XX0

## 2017-06-28 LAB — CBC
HEMATOCRIT: 36.1 % (ref 36.0–46.0)
HEMOGLOBIN: 12.3 g/dL (ref 12.0–15.0)
MCH: 29.5 pg (ref 26.0–34.0)
MCHC: 34.1 g/dL (ref 30.0–36.0)
MCV: 86.6 fL (ref 78.0–100.0)
Platelets: 263 10*3/uL (ref 150–400)
RBC: 4.17 MIL/uL (ref 3.87–5.11)
RDW: 14.1 % (ref 11.5–15.5)
WBC: 9.5 10*3/uL (ref 4.0–10.5)

## 2017-06-28 LAB — TYPE AND SCREEN
ABO/RH(D): A POS
ANTIBODY SCREEN: NEGATIVE

## 2017-06-28 LAB — RPR: RPR Ser Ql: NONREACTIVE

## 2017-06-28 SURGERY — Surgical Case
Anesthesia: Epidural

## 2017-06-28 MED ORDER — HYDROMORPHONE HCL 1 MG/ML IJ SOLN: 0.2500 mg | INTRAMUSCULAR | Status: DC | PRN

## 2017-06-28 MED ORDER — LIDOCAINE HCL (PF) 1 % IJ SOLN
INTRAMUSCULAR | Status: DC | PRN
  Administered 2017-06-28: 13 mL via EPIDURAL

## 2017-06-28 MED ORDER — ONDANSETRON HCL 4 MG/2ML IJ SOLN
4.0000 mg | Freq: Four times a day (QID) | INTRAMUSCULAR | Status: DC | PRN
  Administered 2017-06-28: 4 mg via INTRAVENOUS

## 2017-06-28 MED ORDER — MEPERIDINE HCL 25 MG/ML IJ SOLN
INTRAMUSCULAR | Status: AC
  Filled 2017-06-28: qty 1

## 2017-06-28 MED ORDER — PHENYLEPHRINE 40 MCG/ML (10ML) SYRINGE FOR IV PUSH (FOR BLOOD PRESSURE SUPPORT): 80.0000 ug | PREFILLED_SYRINGE | INTRAVENOUS | Status: DC | PRN

## 2017-06-28 MED ORDER — OXYCODONE HCL 5 MG PO TABS: 5.0000 mg | ORAL_TABLET | Freq: Once | ORAL | Status: DC | PRN

## 2017-06-28 MED ORDER — PHENYLEPHRINE 40 MCG/ML (10ML) SYRINGE FOR IV PUSH (FOR BLOOD PRESSURE SUPPORT)
80.0000 ug | PREFILLED_SYRINGE | INTRAVENOUS | Status: DC | PRN
  Filled 2017-06-28: qty 10

## 2017-06-28 MED ORDER — LACTATED RINGERS IV SOLN
INTRAVENOUS | Status: DC
  Administered 2017-06-28 (×5): via INTRAVENOUS

## 2017-06-28 MED ORDER — MORPHINE SULFATE (PF) 0.5 MG/ML IJ SOLN
INTRAMUSCULAR | Status: AC
  Filled 2017-06-28: qty 10

## 2017-06-28 MED ORDER — SCOPOLAMINE 1 MG/3DAYS TD PT72
MEDICATED_PATCH | TRANSDERMAL | Status: AC
  Filled 2017-06-28: qty 1

## 2017-06-28 MED ORDER — MORPHINE SULFATE (PF) 0.5 MG/ML IJ SOLN
INTRAMUSCULAR | Status: DC | PRN
  Administered 2017-06-28: 4 mg via EPIDURAL

## 2017-06-28 MED ORDER — NALOXONE HCL 2 MG/2ML IJ SOSY
1.0000 ug/kg/h | PREFILLED_SYRINGE | INTRAVENOUS | Status: DC | PRN
  Filled 2017-06-28: qty 2

## 2017-06-28 MED ORDER — DIPHENHYDRAMINE HCL 50 MG/ML IJ SOLN: 12.5000 mg | INTRAMUSCULAR | Status: DC | PRN

## 2017-06-28 MED ORDER — DEXAMETHASONE SODIUM PHOSPHATE 10 MG/ML IJ SOLN
INTRAMUSCULAR | Status: DC | PRN
  Administered 2017-06-28: 10 mg via INTRAVENOUS

## 2017-06-28 MED ORDER — SOD CITRATE-CITRIC ACID 500-334 MG/5ML PO SOLN
30.0000 mL | ORAL | Status: DC | PRN
  Administered 2017-06-28: 30 mL via ORAL
  Filled 2017-06-28: qty 15

## 2017-06-28 MED ORDER — LACTATED RINGERS IV SOLN
500.0000 mL | Freq: Once | INTRAVENOUS | Status: AC
  Administered 2017-06-28: 500 mL via INTRAVENOUS

## 2017-06-28 MED ORDER — NALBUPHINE HCL 10 MG/ML IJ SOLN: 5.0000 mg | INTRAMUSCULAR | Status: DC | PRN

## 2017-06-28 MED ORDER — TERBUTALINE SULFATE 1 MG/ML IJ SOLN: 0.2500 mg | Freq: Once | INTRAMUSCULAR | Status: DC | PRN

## 2017-06-28 MED ORDER — OXYTOCIN 10 UNIT/ML IJ SOLN
INTRAVENOUS | Status: DC | PRN
  Administered 2017-06-28: 40 [IU] via INTRAVENOUS

## 2017-06-28 MED ORDER — ONDANSETRON HCL 4 MG/2ML IJ SOLN
INTRAMUSCULAR | Status: AC
  Filled 2017-06-28: qty 2

## 2017-06-28 MED ORDER — FENTANYL 2.5 MCG/ML BUPIVACAINE 1/10 % EPIDURAL INFUSION (WH - ANES)
14.0000 mL/h | INTRAMUSCULAR | Status: DC | PRN
Start: 2017-06-28 — End: 2017-06-28
  Administered 2017-06-28: 14 mL/h via EPIDURAL
  Filled 2017-06-28: qty 100

## 2017-06-28 MED ORDER — ACETAMINOPHEN 325 MG PO TABS: 650.0000 mg | ORAL_TABLET | ORAL | Status: DC | PRN

## 2017-06-28 MED ORDER — OXYTOCIN 10 UNIT/ML IJ SOLN
INTRAMUSCULAR | Status: AC
  Filled 2017-06-28: qty 4

## 2017-06-28 MED ORDER — LIDOCAINE-EPINEPHRINE (PF) 2 %-1:200000 IJ SOLN
INTRAMUSCULAR | Status: AC
  Filled 2017-06-28: qty 20

## 2017-06-28 MED ORDER — SCOPOLAMINE 1 MG/3DAYS TD PT72
1.0000 | MEDICATED_PATCH | Freq: Once | TRANSDERMAL | Status: DC
  Administered 2017-06-28: 1.5 mg via TRANSDERMAL

## 2017-06-28 MED ORDER — KETOROLAC TROMETHAMINE 30 MG/ML IJ SOLN
INTRAMUSCULAR | Status: AC
  Filled 2017-06-28: qty 1

## 2017-06-28 MED ORDER — NALBUPHINE HCL 10 MG/ML IJ SOLN: 5.0000 mg | Freq: Once | INTRAMUSCULAR | Status: DC | PRN

## 2017-06-28 MED ORDER — MEPERIDINE HCL 25 MG/ML IJ SOLN
6.2500 mg | INTRAMUSCULAR | Status: DC | PRN
  Administered 2017-06-28: 6.25 mg via INTRAVENOUS

## 2017-06-28 MED ORDER — ONDANSETRON HCL 4 MG/2ML IJ SOLN: 4.0000 mg | Freq: Three times a day (TID) | INTRAMUSCULAR | Status: DC | PRN

## 2017-06-28 MED ORDER — SODIUM CHLORIDE 0.9 % IR SOLN
Status: DC | PRN
  Administered 2017-06-28: 1

## 2017-06-28 MED ORDER — EPHEDRINE 5 MG/ML INJ: 10.0000 mg | INTRAVENOUS | Status: DC | PRN

## 2017-06-28 MED ORDER — SODIUM BICARBONATE 8.4 % IV SOLN
INTRAVENOUS | Status: DC | PRN
  Administered 2017-06-28 (×3): 5 mL via EPIDURAL

## 2017-06-28 MED ORDER — SODIUM BICARBONATE 8.4 % IV SOLN
INTRAVENOUS | Status: AC
  Filled 2017-06-28: qty 50

## 2017-06-28 MED ORDER — DEXAMETHASONE SODIUM PHOSPHATE 10 MG/ML IJ SOLN
INTRAMUSCULAR | Status: AC
  Filled 2017-06-28: qty 1

## 2017-06-28 MED ORDER — LACTATED RINGERS IV SOLN: 500.0000 mL | INTRAVENOUS | Status: DC | PRN

## 2017-06-28 MED ORDER — PROMETHAZINE HCL 25 MG/ML IJ SOLN: 6.2500 mg | INTRAMUSCULAR | Status: DC | PRN

## 2017-06-28 MED ORDER — CEFAZOLIN SODIUM-DEXTROSE 2-3 GM-%(50ML) IV SOLR
INTRAVENOUS | Status: AC
  Filled 2017-06-28: qty 50

## 2017-06-28 MED ORDER — SODIUM CHLORIDE 0.9% FLUSH: 3.0000 mL | INTRAVENOUS | Status: DC | PRN

## 2017-06-28 MED ORDER — CEFAZOLIN SODIUM-DEXTROSE 2-3 GM-%(50ML) IV SOLR
INTRAVENOUS | Status: DC | PRN
  Administered 2017-06-28: 2 g via INTRAVENOUS

## 2017-06-28 MED ORDER — OXYCODONE HCL 5 MG/5ML PO SOLN: 5.0000 mg | Freq: Once | ORAL | Status: DC | PRN

## 2017-06-28 MED ORDER — OXYTOCIN 40 UNITS IN LACTATED RINGERS INFUSION - SIMPLE MED
1.0000 m[IU]/min | INTRAVENOUS | Status: DC
  Administered 2017-06-28: 2 m[IU]/min via INTRAVENOUS
  Filled 2017-06-28: qty 1000

## 2017-06-28 MED ORDER — NALOXONE HCL 0.4 MG/ML IJ SOLN: 0.4000 mg | INTRAMUSCULAR | Status: DC | PRN

## 2017-06-28 MED ORDER — DIPHENHYDRAMINE HCL 25 MG PO CAPS
25.0000 mg | ORAL_CAPSULE | ORAL | Status: DC | PRN
  Filled 2017-06-28: qty 1

## 2017-06-28 SURGICAL SUPPLY — 36 items
BENZOIN TINCTURE PRP APPL 2/3 (GAUZE/BANDAGES/DRESSINGS) ×3 IMPLANT
CHLORAPREP W/TINT 26ML (MISCELLANEOUS) ×3 IMPLANT
CLAMP CORD UMBIL (MISCELLANEOUS) IMPLANT
CLOSURE STERI STRIP 1/2 X4 (GAUZE/BANDAGES/DRESSINGS) ×3 IMPLANT
CLOSURE WOUND 1/2 X4 (GAUZE/BANDAGES/DRESSINGS)
CLOTH BEACON ORANGE TIMEOUT ST (SAFETY) ×3 IMPLANT
CONTAINER PREFILL 10% NBF 15ML (MISCELLANEOUS) IMPLANT
DRSG OPSITE POSTOP 4X10 (GAUZE/BANDAGES/DRESSINGS) ×3 IMPLANT
ELECT REM PT RETURN 9FT ADLT (ELECTROSURGICAL) ×3
ELECTRODE REM PT RTRN 9FT ADLT (ELECTROSURGICAL) ×1 IMPLANT
EXTRACTOR VACUUM KIWI (MISCELLANEOUS) IMPLANT
EXTRACTOR VACUUM M CUP 4 TUBE (SUCTIONS) IMPLANT
EXTRACTOR VACUUM M CUP 4' TUBE (SUCTIONS)
GLOVE BIO SURGEON STRL SZ7 (GLOVE) ×3 IMPLANT
GLOVE BIOGEL PI IND STRL 7.0 (GLOVE) ×2 IMPLANT
GLOVE BIOGEL PI INDICATOR 7.0 (GLOVE) ×4
GOWN STRL REUS W/TWL LRG LVL3 (GOWN DISPOSABLE) ×6 IMPLANT
KIT ABG SYR 3ML LUER SLIP (SYRINGE) IMPLANT
NEEDLE HYPO 25X5/8 SAFETYGLIDE (NEEDLE) IMPLANT
NS IRRIG 1000ML POUR BTL (IV SOLUTION) ×3 IMPLANT
PACK C SECTION WH (CUSTOM PROCEDURE TRAY) ×3 IMPLANT
PAD OB MATERNITY 4.3X12.25 (PERSONAL CARE ITEMS) ×3 IMPLANT
RTRCTR C-SECT PINK 25CM LRG (MISCELLANEOUS) IMPLANT
STRIP CLOSURE SKIN 1/2X4 (GAUZE/BANDAGES/DRESSINGS) IMPLANT
SUT MON AB-0 CT1 36 (SUTURE) ×6 IMPLANT
SUT PLAIN 0 NONE (SUTURE) IMPLANT
SUT PLAIN 2 0 (SUTURE)
SUT PLAIN ABS 2-0 CT1 27XMFL (SUTURE) IMPLANT
SUT VIC AB 0 CT1 27 (SUTURE) ×4
SUT VIC AB 0 CT1 27XBRD ANBCTR (SUTURE) ×2 IMPLANT
SUT VIC AB 2-0 CT1 27 (SUTURE) ×2
SUT VIC AB 2-0 CT1 TAPERPNT 27 (SUTURE) ×1 IMPLANT
SUT VIC AB 4-0 KS 27 (SUTURE) ×3 IMPLANT
SUT VICRYL 0 TIES 12 18 (SUTURE) IMPLANT
TOWEL OR 17X24 6PK STRL BLUE (TOWEL DISPOSABLE) ×3 IMPLANT
TRAY FOLEY BAG SILVER LF 14FR (SET/KITS/TRAYS/PACK) IMPLANT

## 2017-06-28 NOTE — Anesthesia Pain Management Evaluation Note (Signed)
  CRNA Pain Management Visit Note  Patient: Roberta StampsLisbeth Hochstatter, 31 y.o., female  "Hello [facility] am a [facility] the [facility]. We have an anesthesia team available at all times to provide care throughout the hospital, including epidural management and anesthesia for C-section. I don't know your plan for the delivery whether it a natural birth, water birth, IV sedation, nitrous supplementation, doula or epidural, but we want to meet your pain goals."   1.Was your pain managed to your expectations on prior hospitalizations?   Yes   2.What is your expectation for pain management during this hospitalization?     Epidural  3.How can we help you reach that goal? unsure  Record the patient's initial score and the patient's pain goal.   Pain: 0  Pain Goal: 7 [facility] wants you to be able to say your pain was always managed very well.  Cephus ShellingBURGER,Deneane Stifter 06/28/2017

## 2017-06-28 NOTE — Transfer of Care (Signed)
Immediate Anesthesia Transfer of Care Note  Patient: [name]  Procedure(s) Performed: CESAREAN SECTION (N/A )  Patient Location: PACU  Anesthesia Type:Epidural  Level of Consciousness: awake  Airway & Oxygen Therapy: Patient Spontanous Breathing  Post-op Assessment: Report given to RN  Post vital signs: Reviewed and stable  Last Vitals:  Vitals:   06/28/17 2030 06/28/17 2101  BP: (!) 105/50 99/68  Pulse: 72 (!) 251  Resp:    Temp:      Last Pain:  Vitals:   06/28/17 1931  TempSrc: Oral  PainSc:          Complications: No apparent anesthesia complications

## 2017-06-28 NOTE — Progress Notes (Signed)
Patient ID: Roberta StampsLisbeth Lepkowski, female   DOB: 1986-03-05, 31 y.o.   MRN: [id number]  Late entry after C-section: Pt was seen in her labor room and bedside sono performed by me (after she was noted to be breech per RN's last exam). Breech presentation confirmed and athough baby was high all day in station he was noted to be cephalic and was being induced since this morning for polyhydramios.  She has 4 ultrasounds in 3rd trimester, at 33 wks baby was breech with polyhydramnios but at 35 wk, 37 wk and 38 wk he was cephalic (last sono on 06/23/17).  Pitocin followed by controlled artificial rupture of membranes done since head was floating but head was well applied to cervix after AROM. Epidural was working well. At 5-6 cm baby was still cephalic but at -2 station. Exaggerates Sims given to allow flexion and descent. RN called when next exam noted Breech presentation and was confirmed by in-house MD and me by bedside ultrasound. Pitocin stopped, she was 10 cm dilated, was advised to proceed with C-section due to fetal risks. The risks, benefits, complications, treatment options, and expected outcomes were discussed with the patient. The patient concurred with the proposed plan, giving informed consent. identified as [name] and the procedure verified as C-Section Delivery  V.Juliene PinaMody, MD

## 2017-06-28 NOTE — Op Note (Signed)
Cesarean Section Procedure Note  Roberta StampsLisbeth [name]  06/28/2017 Procedure: Primary low transverse cesarean section                                         (2 layer closure of hysterotomy)  Indications: Breech Presentation. Labor induction for polyhydramnios, cephalic presentation with spontaneous version to breech in active labor.  Pre-operative Diagnosis: primary cesarean section due to breech presentation in active labor .   Post-operative Diagnosis: Same   Surgeon: Shea EvansMody, [place] Scheiderer, MD    Assistants: Carlean JewsMeredith Sigmon, CNM  Anesthesia: spinal   Procedure Details:  The patient was seen in the Labor Room where she was being induced since this morning for polyhydramios. Pitocin followed by controlled artificial rupture of membranes done since head was floating but head was well applied to cervix after AROM. Epidural was working well. At 5-6 cm baby was still cephalic but at -2 station. Exaggerates Sims given to allow flexion and descent. RN called when next exam noted Breech presentation and was confirmed by in-house MD and me by bedside ultrasound. Pitocin stopped, she was 10 cm dilated, was advised to proceed with C-section due to fetal risks. The risks, benefits, complications, treatment options, and expected outcomes were discussed with the patient. The patient concurred with the proposed plan, giving informed consent. identified as Roberta StampsLisbeth Berkowitz and the procedure verified as C-Section Delivery.  Epidural was working well in OR. A Time Out was held and the above information confirmed. 2 gm Ancef given. She was draped and prepped in the usual sterile manner. Foley was draining well. A Pfannenstiel Incision was made and carried down through the subcutaneous tissue to the fascia. Fascial incision was made and extended transversely. The fascia was separated from the underlying rectus tissue superiorly and inferiorly. The peritoneum was identified and entered. Peritoneal incision was extended  longitudinally. Alexis retractor placed. The utero-vesical peritoneal reflection was incised transversely and the bladder flap was bluntly freed from the lower uterine segment. A low transverse uterine incision was made. Baby was in Complete breech presentation. Baby BOY was delivered by complete breech extraction in steps and keeping head flexed. Infant cried at birth and was stimulated and dried. Delayed cord clamping done. Apgar scores of 8 at one minute and 9 at five minutes. Cord ph was not sent. Umbilical cord blood was obtained for evaluation.  The placenta was removed Intact and appeared normal. The uterine outline, tubes and ovaries appeared normal.  The uterine incision was closed with running locked sutures of 0-Monocryl followed by second imbricating layer. Hemostasis was observed. Alexis retractor placed. Peritoneum closed with 2-0 Vicryl.  The fascia was then reapproximated with running sutures of 0Vicryl. The subcuticular closure was performed using 2-0plain gut. The skin was closed with 4-0Vicryl. Steristrips and Honeycomb dressing placed.   Instrument, sponge, and needle counts were correct prior the abdominal closure and were correct at the conclusion of the case.   Findings: [facility], by complete breech extraction at 21.24 hours on 06/28/17. Apgars 8 and 9 at 1 and 5 minutes. Placenta, cord, ovaries, tubes normal.    Estimated Blood Loss: 687 mL   Total IV Fluids: 1600 ml LR   Urine Output: 350CC OF clear urine  Specimens: cord blood   Complications: no complications  Disposition: PACU - hemodynamically stable.   Maternal Condition: stable   Baby condition / location:  Couplet  care / Skin to Skin  Attending Attestation: I performed the procedure.   Signed: Surgeon(s): Shea Evans, MD

## 2017-06-28 NOTE — Progress Notes (Signed)
IOL for polyhydramios, LGA. Prior 7'10" SVD, now 8.1/2 lbs.  BP 125/81   Pulse 86   Temp 97.7 F (36.5 C) (Oral)   Resp 18   Ht 5\' 4"  (1.626 m)   Wt 206 lb (93.4 kg)   BMI 35.36 kg/m   FHT 140s/ + accels no decels/ mod varib Cx 3-4/ 50%/-3, unflexed head. Tried ambulation and ball.  recc exaggerated Sims Epidural in active labor  V.Juliene PinaMody, MD

## 2017-06-28 NOTE — Progress Notes (Addendum)
IOL for polyhydramios, LGA. Prior 7'10" SVD, now 8.1/2 lbs.  BP (!) 105/50   Pulse 72   Temp 97.7 F (36.5 C) (Oral)   Resp 18   Ht 5\' 4"  (1.626 m)   Wt 206 lb (93.4 kg)   BMI 35.36 kg/m   FHT 140s/ + accels no decels/ mod varib Cx 5-6/ 60%/-2-3  Continue exaggerated Sims Epidural working well  V.Juliene PinaMody, MD

## 2017-06-28 NOTE — Progress Notes (Signed)
IOL for polyhydramios, LGA. Prior 7'10" SVD, now 8.1/2 lbs.  VS nl.  FHT 140s/ + accels no decels/ mod varib Cx 2-3 cm/ stn at inleft with fundal pressure, controlled AROM, copious clear fluid. Head well applied to cx after AROM.  Unflexed head Exaggerated Sims, ambulate after 30 min to allow descent Epidural in active labor  V.Juliene PinaMody, MD

## 2017-06-28 NOTE — Anesthesia Procedure Notes (Signed)
Epidural Patient location during procedure: OB Start time: 06/28/2017 5:58 PM End time: 06/28/2017 6:13 PM  Staffing Anesthesiologist: Anitra LauthMILLER, Taila Basinski RAY Performed: anesthesiologist   Preanesthetic Checklist Completed: patient identified, site marked, surgical consent, pre-op evaluation, timeout performed, IV checked, risks and benefits discussed and monitors and equipment checked  Epidural Patient position: sitting Prep: ChloraPrep Patient monitoring: heart rate, cardiac monitor, continuous pulse ox and blood pressure Approach: midline Location: L2-L3 Injection technique: LOR saline  Needle:  Needle type: Tuohy  Needle gauge: 17 G Needle length: 9 cm Needle insertion depth: 5.5 cm Catheter type: closed end flexible Catheter size: 20 Guage Catheter at skin depth: 9 cm Test dose: negative  Assessment Events: blood not aspirated, injection not painful, no injection resistance, negative IV test and no paresthesia  Additional Notes Reason for block:procedure for pain

## 2017-06-28 NOTE — Anesthesia Preprocedure Evaluation (Addendum)
Anesthesia Evaluation  Patient identified by MRN, date of birth, ID band Patient awake    Reviewed: Allergy & Precautions, H&P , NPO status , Patient's Chart, lab work & pertinent test results  Airway Mallampati: II  TM Distance: >3 FB Neck ROM: full    Dental no notable dental hx. (+) Teeth Intact   Pulmonary neg pulmonary ROS,    Pulmonary exam normal breath sounds clear to auscultation       Cardiovascular negative cardio ROS Normal cardiovascular exam Rhythm:regular Rate:Normal     Neuro/Psych negative neurological ROS  negative psych ROS   GI/Hepatic negative GI ROS, Neg liver ROS,   Endo/Other  negative endocrine ROS  Renal/GU negative Renal ROS     Musculoskeletal negative musculoskeletal ROS (+)   Abdominal (+) + obese,   Peds  Hematology negative hematology ROS (+)   Anesthesia Other Findings   Reproductive/Obstetrics (+) Pregnancy                             Anesthesia Physical Anesthesia Plan  ASA: II and emergent  Anesthesia Plan: Epidural   Post-op Pain Management:    Induction:   PONV Risk Score and Plan:   Airway Management Planned:   Additional Equipment:   Intra-op Plan:   Post-operative Plan:   Informed Consent: I have reviewed the patients History and Physical, chart, labs and discussed the procedure including the risks, benefits and alternatives for the proposed anesthesia with the patient or authorized representative who has indicated his/her understanding and acceptance.     Plan Discussed with:   Anesthesia Plan Comments:        Anesthesia Quick Evaluation

## 2017-06-28 NOTE — H&P (Signed)
[name] is a 31 y.o. female 724P1021. 39 wks, IOL for polyhydramnios. EFW 8'5" 91% and AC 95% on 10/17. AFI down to 18.5 cm from 25 cm at 34 wks. Getting NST wkly since 36 wks. No GDM, Took bbASA till 20 wks for prior MABx2.  Prior SVD 7'10"   OB History    Gravida Para Term Preterm AB Living   4 1 1   2 1    SAB TAB Ectopic Multiple Live Births   2       1     Past Medical History:  Diagnosis Date  . Medical history non-contributory    Past Surgical History:  Procedure Laterality Date  . DILATION AND EVACUATION N/A 09/14/2014   Procedure: DILATATION AND EVACUATION;  Surgeon: Lenoard Adenichard J Taavon, MD;  Location: [facility];  Service: Gynecology;  Laterality: N/A;  . DILATION AND EVACUATION N/A 02/19/2015   Procedure: DILATATION AND EVACUATION With Tissues Sent For Chromosome Analysis;  Surgeon: Olivia Mackieichard Taavon, MD;  Location: [facility];  Service: Gynecology;  Laterality: N/A;  . MOUTH SURGERY    . TONSILLECTOMY     Family History: family history is not on file. Social History:  reports that she has never smoked. She has never used smokeless tobacco. She reports that she does not drink alcohol or use drugs.     Maternal Diabetes: No Genetic Screening: Normal Maternal Ultrasounds/Referrals: Normal - but polyhydramnios and LGA Fetal Ultrasounds or other Referrals:  None Maternal Substance Abuse:  No Significant Maternal Medications:  Meds include: Other:  bbASA till 20 wks Significant Maternal Lab Results:  Lab values include: Group B Strep negative Other Comments:  None  ROS History Dilation: 2 Effacement (%): Thick Station: Ballotable Exam by:: Roberta CarsonLindsay Lima, rn Blood pressure 115/78, pulse 75, resp. rate 18, height 5\' 4"  (1.626 m), weight 206 lb (93.4 kg), unknown if currently breastfeeding. Exam Physical Exam  Physical exam:  A&O x 3, no acute distress. Pleasant HEENT neg, no thyromegaly Lungs CTA bilat CV RRR S1S2 normal Abdo soft, non tender, non acute Extr no edema/  tenderness Pelvic above FHT  130s - cat I Toco irreg  Prenatal labs: ABO, Rh: A/Positive/-- (04/09 0000) Antibody: Negative (04/09 0000) Rubella: Immune (04/09 0000) RPR: Nonreactive (04/09 0000)  HBsAg: Negative (04/09 0000)  HIV: Non-reactive (04/09 0000)  GBS: Negative (09/25 0000)   Assessment/Plan: 31 yo 39 wks G4P1021, IOL for polyhydramnios and LGA. EFW 9 lbs, large AC. Pitocin. Epid ok.  Watch for progress in 2nd stage, prepare room for shoulder dystocia.    Onyx Edgley R 06/28/2017, 8:46 AM

## 2017-06-29 ENCOUNTER — Encounter (HOSPITAL_COMMUNITY): Payer: Self-pay

## 2017-06-29 LAB — CBC
HEMATOCRIT: 36.8 % (ref 36.0–46.0)
Hemoglobin: 12.7 g/dL (ref 12.0–15.0)
MCH: 29.7 pg (ref 26.0–34.0)
MCHC: 34.5 g/dL (ref 30.0–36.0)
MCV: 86.2 fL (ref 78.0–100.0)
PLATELETS: 245 10*3/uL (ref 150–400)
RBC: 4.27 MIL/uL (ref 3.87–5.11)
RDW: 14 % (ref 11.5–15.5)
WBC: 17 10*3/uL — AB (ref 4.0–10.5)

## 2017-06-29 MED ORDER — PRENATAL MULTIVITAMIN CH
1.0000 | ORAL_TABLET | Freq: Every day | ORAL | Status: DC
  Administered 2017-06-29 – 2017-07-01 (×3): 1 via ORAL
  Filled 2017-06-29 (×3): qty 1

## 2017-06-29 MED ORDER — SIMETHICONE 80 MG PO CHEW: 80.0000 mg | CHEWABLE_TABLET | ORAL | Status: DC | PRN

## 2017-06-29 MED ORDER — DIPHENHYDRAMINE HCL 25 MG PO CAPS: 25.0000 mg | ORAL_CAPSULE | Freq: Four times a day (QID) | ORAL | Status: DC | PRN

## 2017-06-29 MED ORDER — WITCH HAZEL-GLYCERIN EX PADS: 1.0000 "application " | MEDICATED_PAD | CUTANEOUS | Status: DC | PRN

## 2017-06-29 MED ORDER — ACETAMINOPHEN 325 MG PO TABS
650.0000 mg | ORAL_TABLET | ORAL | Status: DC | PRN
  Administered 2017-06-29 – 2017-06-30 (×3): 650 mg via ORAL
  Filled 2017-06-29 (×3): qty 2

## 2017-06-29 MED ORDER — ZOLPIDEM TARTRATE 5 MG PO TABS: 5.0000 mg | ORAL_TABLET | Freq: Every evening | ORAL | Status: DC | PRN

## 2017-06-29 MED ORDER — MENTHOL 3 MG MT LOZG: 1.0000 | LOZENGE | OROMUCOSAL | Status: DC | PRN

## 2017-06-29 MED ORDER — LORATADINE 10 MG PO TABS
10.0000 mg | ORAL_TABLET | Freq: Every day | ORAL | Status: DC
  Administered 2017-06-29 – 2017-06-30 (×2): 10 mg via ORAL
  Filled 2017-06-29 (×3): qty 1

## 2017-06-29 MED ORDER — OXYTOCIN 40 UNITS IN LACTATED RINGERS INFUSION - SIMPLE MED: 2.5000 [IU]/h | INTRAVENOUS | Status: AC

## 2017-06-29 MED ORDER — TETANUS-DIPHTH-ACELL PERTUSSIS 5-2.5-18.5 LF-MCG/0.5 IM SUSP: 0.5000 mL | Freq: Once | INTRAMUSCULAR | Status: DC

## 2017-06-29 MED ORDER — IBUPROFEN 600 MG PO TABS
600.0000 mg | ORAL_TABLET | Freq: Four times a day (QID) | ORAL | Status: DC
  Administered 2017-06-29 – 2017-06-30 (×5): 600 mg via ORAL
  Filled 2017-06-29 (×4): qty 1

## 2017-06-29 MED ORDER — COCONUT OIL OIL
1.0000 "application " | TOPICAL_OIL | Status: DC | PRN
  Administered 2017-07-01: 1 via TOPICAL
  Filled 2017-06-29: qty 120

## 2017-06-29 MED ORDER — SIMETHICONE 80 MG PO CHEW
80.0000 mg | CHEWABLE_TABLET | ORAL | Status: DC
  Administered 2017-06-29 – 2017-06-30 (×3): 80 mg via ORAL
  Filled 2017-06-29: qty 1

## 2017-06-29 MED ORDER — OXYCODONE HCL 5 MG PO TABS
5.0000 mg | ORAL_TABLET | ORAL | Status: DC | PRN
  Administered 2017-06-29 – 2017-07-01 (×5): 5 mg via ORAL
  Filled 2017-06-29 (×5): qty 1

## 2017-06-29 MED ORDER — DIBUCAINE 1 % RE OINT: 1.0000 "application " | TOPICAL_OINTMENT | RECTAL | Status: DC | PRN

## 2017-06-29 MED ORDER — LACTATED RINGERS IV SOLN
INTRAVENOUS | Status: DC
  Administered 2017-06-29: 06:00:00 via INTRAVENOUS

## 2017-06-29 MED ORDER — FLUTICASONE PROPIONATE 50 MCG/ACT NA SUSP
1.0000 | Freq: Every day | NASAL | Status: DC
  Administered 2017-06-29 – 2017-06-30 (×2): 1 via NASAL
  Filled 2017-06-29: qty 16

## 2017-06-29 MED ORDER — SIMETHICONE 80 MG PO CHEW
80.0000 mg | CHEWABLE_TABLET | Freq: Three times a day (TID) | ORAL | Status: DC
  Administered 2017-06-29 – 2017-07-01 (×7): 80 mg via ORAL
  Filled 2017-06-29 (×7): qty 1

## 2017-06-29 MED ORDER — SENNOSIDES-DOCUSATE SODIUM 8.6-50 MG PO TABS
2.0000 | ORAL_TABLET | ORAL | Status: DC
  Administered 2017-06-29 – 2017-06-30 (×3): 2 via ORAL
  Filled 2017-06-29: qty 2

## 2017-06-29 NOTE — Anesthesia Postprocedure Evaluation (Signed)
Anesthesia Post Note  Patient: [name]  Procedure(s) Performed: CESAREAN SECTION (N/A )     Patient location during evaluation: Mother Baby Anesthesia Type: Epidural Level of consciousness: awake and alert and oriented Pain management: satisfactory to patient Vital Signs Assessment: post-procedure vital signs reviewed and stable Respiratory status: respiratory function stable Cardiovascular status: stable Postop Assessment: no headache, no backache, epidural receding, patient able to bend at knees, no signs of nausea or vomiting and adequate PO intake Anesthetic complications: no    Last Vitals:  Vitals:   06/29/17 0245 06/29/17 0500  BP: 95/62 107/68  Pulse: (!) 56 68  Resp: 18 18  Temp: 37 C 36.8 C  SpO2: 97% 96%    Last Pain:  Vitals:   06/29/17 0602  TempSrc:   PainSc: 0-No pain   Pain Goal:                 Breanah Faddis

## 2017-06-29 NOTE — Lactation Note (Signed)
This note was copied from a baby's chart. Lactation Consultation Note  Patient Name: Roberta Karolee StampsLisbeth Applegate JOACZ'YToday's Date: 06/29/2017 Reason for consult: Follow-up assessment   Request from mother to assist w/ breastfeeding. Reviewed hand expression prior to latching.  Drops expressed. Assisted w/ football hold and compressing breast during feeding. Due to circ today baby is sleepy.  Encouraged mother to undress baby and feed STS.   Maternal Data Has patient been taught Hand Expression?: Yes Does the patient have breastfeeding experience prior to this delivery?: No  Feeding Feeding Type: Breast Fed Length of feed: 15 min  LATCH Score Latch: Grasps breast easily, tongue down, lips flanged, rhythmical sucking.  Audible Swallowing: A few with stimulation  Type of Nipple: Everted at rest and after stimulation  Comfort (Breast/Nipple): Soft / non-tender  Hold (Positioning): Assistance needed to correctly position infant at breast and maintain latch.  LATCH Score: 8  Interventions Interventions: Breast feeding basics reviewed;Assisted with latch;Skin to skin;Hand express  Lactation Tools Discussed/Used     Consult Status Consult Status: Follow-up Date: 06/30/17 Follow-up type: In-patient    Dahlia ByesBerkelhammer, Hernando Reali [facility] 06/29/2017, 5:54 PM

## 2017-06-29 NOTE — Lactation Note (Addendum)
This note was copied from a baby's chart. Lactation Consultation Note  Patient Name: [name] StampsLisbeth Mcleish FAOZH'YToday's Date: 06/29/2017 Reason for consult: Initial assessment   P2,  Baby 19 hours old.  Mother attempted but decided not to breastfeed first child. Baby had circ today and has been sleepy. Upon entering baby was latched in cross cradle hold.  Intermittent sucks and swallows observed. Provided pillows under mother's arms for support. Suggest undressing and feeding him STS.  Changed diaper. Left brochure with services.    Maternal Data Has patient been taught Hand Expression?: Yes Does the patient have breastfeeding experience prior to this delivery?: No  Feeding Feeding Type: Breast Fed Length of feed: 15 min  LATCH Score Latch: Repeated attempts needed to sustain latch, nipple held in mouth throughout feeding, stimulation needed to elicit sucking reflex.  Audible Swallowing: A few with stimulation  Type of Nipple: Everted at rest and after stimulation  Comfort (Breast/Nipple): Soft / non-tender  Hold (Positioning): Assistance needed to correctly position infant at breast and maintain latch.  LATCH Score: 7  Interventions Interventions: Breast feeding basics reviewed;Assisted with latch;Skin to skin;Hand express  Lactation Tools Discussed/Used     Consult Status Consult Status: Follow-up Date: 06/30/17 Follow-up type: In-patient    Dahlia ByesBerkelhammer, [facility] 06/29/2017, 4:57 PM

## 2017-06-29 NOTE — Anesthesia Postprocedure Evaluation (Signed)
Anesthesia Post Note  Patient: [name]  Procedure(s) Performed: CESAREAN SECTION (N/A )     Patient location during evaluation: PACU Anesthesia Type: Epidural Level of consciousness: awake and alert Pain management: pain level controlled Vital Signs Assessment: post-procedure vital signs reviewed and stable Respiratory status: spontaneous breathing, nonlabored ventilation and respiratory function stable Cardiovascular status: stable and blood pressure returned to baseline Postop Assessment: no apparent nausea or vomiting Anesthetic complications: no    Last Vitals:  Vitals:   06/29/17 0148 06/29/17 0245  BP: 108/62 95/62  Pulse: (!) 54 (!) 56  Resp: 18 18  Temp: 36.9 C 37 C  SpO2: 96% 97%    Last Pain:  Vitals:   06/29/17 0245  TempSrc: Oral  PainSc: 0-No pain   Pain Goal:                 Lowella CurbWarren Ray Miller

## 2017-06-29 NOTE — Addendum Note (Signed)
Addendum  created 06/29/17 0728 by Graciela HusbandsFussell, Magdalyn Arenivas O, CRNA   Sign clinical note

## 2017-06-29 NOTE — Progress Notes (Signed)
POSTOPERATIVE DAY # 1 S/P Primary LTCS for breech presentation    S:         Reports feeling okay, having some incisional pain              Tolerating po intake / no nausea / no vomiting / + flatus / no BM  Denies dizziness, SOB, or CP             Bleeding is light             Pain controlled with Oxycodone IR, Tylenol, Motrin             Up ad lib / ambulatory/ Has not voided since foley catheter has been out - has been 2 hours without catheter   Newborn breast feeding - going well per mom  / Circumcision - done today   O:  VS: BP 107/68 (BP Location: Left Arm)   Pulse 68   Temp 98.3 F (36.8 C) (Oral)   Resp 18   Ht 5\' 4"  (1.626 m)   Wt 93.4 kg (206 lb)   SpO2 96%   Breastfeeding? Unknown   BMI 35.36 kg/m    LABS:               Recent Labs  06/28/17 0900 06/29/17 0544  WBC 9.5 17.0*  HGB 12.3 12.7  PLT 263 245               Bloodtype: --/--/A POS (10/22 0726)  Rubella: Immune (04/09 0000)                                             I&O: Intake/Output      10/22 0701 - 10/23 0700 10/23 0701 - 10/24 0700   I.V. (mL/kg) 1600 (17.1)    Total Intake(mL/kg) 1600 (17.1)    Urine (mL/kg/hr) 1250 (0.6)    Blood 787    Total Output 2037     Net -437                       Physical Exam:             Alert and Oriented X3  Lungs: Clear and unlabored  Heart: regular rate and rhythm / no murmurs  Abdomen: soft, non-tender, non-distended, active bowel sounds in all quadrants             Fundus: firm, non-tender, U-1             Dressing: honeycomb with steri-strips small amount of shadowing on the left side, otherwise c/d/i              Incision:  approximated with sutures / no erythema / no ecchymosis / no drainage  Perineum: intact  Lochia: appropriate, no clots  Extremities: +1 BLE edema, no calf pain or tenderness, SCDs on  A:        POD # 1 S/P Primary LTCS for Breech Presentation            Doing well, stable status  P:        Routine postoperative care         Encouraged drinking PO fluids to facilitate urination  Call if no void 6 hours after catheter was removed for bladder scan and to evaluate for I&O cath  See lactation today  Encouraged rest  when baby rests  May want early discharge home tomorrow   Carlean JewsMeredith Tarren Sabree, MSN, CNM [facility]

## 2017-06-30 MED ORDER — LIDOCAINE HCL 2 % EX GEL
1.0000 "application " | Freq: Once | CUTANEOUS | Status: AC
  Administered 2017-06-30: 1 via URETHRAL
  Filled 2017-06-30: qty 5

## 2017-06-30 MED ORDER — IBUPROFEN 800 MG PO TABS
800.0000 mg | ORAL_TABLET | Freq: Four times a day (QID) | ORAL | Status: DC
  Administered 2017-06-30 – 2017-07-01 (×5): 800 mg via ORAL
  Filled 2017-06-30 (×5): qty 1

## 2017-06-30 MED ORDER — CEPHALEXIN 500 MG PO CAPS
500.0000 mg | ORAL_CAPSULE | Freq: Two times a day (BID) | ORAL | Status: DC
Start: 2017-06-30 — End: 2017-07-01
  Administered 2017-06-30 – 2017-07-01 (×3): 500 mg via ORAL
  Filled 2017-06-30 (×5): qty 1

## 2017-06-30 NOTE — Progress Notes (Signed)
Interval note for Acute urinary retention:  Called by nursing staff to notify me that pt. Has not been able to void on her own since foley catheter was removed around 12pm today.  Bladder scan was performed at 6pm showing 900mL of urine and straight cath was performed.  RN reports it has now been another 6 hours and pt. Still unable to void.    Plan: repeat bladder scan now, and straight cath.  If no void in 6 hours after this cath, will replace foley catheter.   Carlean JewsMeredith Sheri Prows, CNM

## 2017-06-30 NOTE — Progress Notes (Signed)
POSTOPERATIVE DAY # 2 S/P CS for breech presentation in labor  S:        Reports feeling really sore - lots of pain                   States she is really concerned unable to void - worried about surgery complication             Tolerating po intake / no nausea / no vomiting / no flatus / no BM             Bleeding is light             Pain controlled with Motrin and oxycodone             Up ad lib / ambulatory/ voiding QS  Newborn breast feeding    O:  VS: BP 117/61 (BP Location: Right Arm)   Pulse 62   Temp 98 F (36.7 C) (Oral)   Resp 18   Ht 5\' 4"  (1.626 m)   Wt 93.4 kg (206 lb)   SpO2 96%   Breastfeeding? Unknown   BMI 35.36 kg/m    LABS:               Recent Labs  06/28/17 0900 06/29/17 0544  WBC 9.5 17.0*  HGB 12.3 12.7  PLT 263 245               Bloodtype: --/--/A POS (10/22 0726)  Rubella: Immune (04/09 0000)              tdap 2018 / declines flu vaccine                                           I&O: Intake/Output      10/23 0701 - 10/24 0700 10/24 0701 - 10/25 0700   Urine (mL/kg/hr) 2250 (1)    Total Output 2250     Net -2250                     Catheterized x4 with output- 500 to  each cath              6-8 hours between caths / voiding attempts               Physical Exam:             Alert and Oriented X3  Lungs: Clear and unlabored  Heart: regular rate and rhythm / no mumurs  Abdomen: soft, non-tender, non-distended, hypoactive BS             Fundus: firm, non-tender, Ueven             Dressing intact              Incision:  approximated with suture / no erythema / no ecchymosis / no drainage  Perineum: intact  Lochia: light  Extremities: trace edema, no calf pain or tenderness  A:        POD # 2 S/P CS -  breech presentation in labor            Urinary retention - reviewed not uncommon with anesthesia and over-distended bladder  plan to re-place foley to decompress bladder x24 hours then  re-attempt voiding                                          will need to go to bathroom every 2-4 hours when catheter out - do not wait until urge to void  P:        Routine postoperative care              Foley orders placed with bladder trial tomorrow AM starting at 0600             Dr Juliene PinaMody consulted             Urine culture sent and prophylactic ABX course with Keflex 500 BID x 5 days initiated             Motrin dose adjusted for pain - encouraged to ambulate today    Roberta MikeBAILEY, Roberta CNM, MSN, [name] 06/30/2017, 8:12 AM

## 2017-06-30 NOTE — Plan of Care (Signed)
Problem: Life Cycle: Goal: Chance of risk for complications during the postpartum period will decrease Outcome: Not Progressing Patient has experienced urinary retention which is being treated with bladder rest and continuous indwelling urinary catheter.   Problem: Urinary Elimination: Goal: Ability to reestablish a normal urinary elimination pattern will improve Outcome: Not Progressing As previously noted.

## 2017-07-01 MED ORDER — IBUPROFEN 800 MG PO TABS: 800.0000 mg | ORAL_TABLET | Freq: Four times a day (QID) | ORAL | 0 refills | Status: DC

## 2017-07-01 MED ORDER — CEPHALEXIN 500 MG PO CAPS: 500.0000 mg | ORAL_CAPSULE | Freq: Two times a day (BID) | ORAL | 0 refills | Status: DC

## 2017-07-01 MED ORDER — OXYCODONE-ACETAMINOPHEN 7.5-325 MG PO TABS: 1.0000 | ORAL_TABLET | ORAL | 0 refills | Status: AC | PRN

## 2017-07-01 NOTE — Lactation Note (Signed)
This note was copied from a baby's chart. Lactation Consultation Note  Patient Name: [name] ZOXWR'UToday's Date: 07/01/2017   Mom was mildly engorged at the beginning of the consult, but now she is just very full. Compression/positional stripes on nipples noted bilaterally. Twenty-eight hours had elapsed since the infant's last void.   At rest, Mom's nipple is flat, but "[name]" was able to latch w/teacup hold. Infant's mandible needed to be lowered & Mom was comfortable w/latch. Mom was assisted with doing breast compressions & showing her cheek placement, etc. [name] was observed to releases latch a few times during the consult. Each time, Mom's nipple was nicely rounded & not pinched.   On suck exam, "[name]" is easily able to draw the finger to the juncture of the hard & soft palate.  Mom has a Medela DEBP at home. Mom was provided size 21 flanges based on her nipple diameter. Mom tried the size 21 flange on the hand pump & felt comfortable. Coconut oil was used to show mom how to lubricate flange. Mom knows that she can pump if she is uncomfortably full after feedings (not to empty breasts, but to pump for comfort).  Mom was placed in shells to promote leaking (and to prevent friction between nipple & fabric).  Mom to use as desired.   Pictorial pee & poop sheets provided so that parents can follow output.   [name], [name] [facility] 07/01/2017, 8:37 AM

## 2017-07-01 NOTE — Progress Notes (Signed)
POSTOPERATIVE DAY # 3 S/P CS  S:         Reports feeling well and wants to go home - states able to pee x 3 this AM already             Tolerating po intake / no nausea / no vomiting / + flatus /  No BM             Bleeding is light             Pain controlled with motrin             Up ad lib / ambulatory/ voiding QS  Newborn breast feeding   O:  VS: BP 110/66 (BP Location: Right Arm)   Pulse 66   Temp 97.6 F (36.4 C) (Oral)   Resp 20   Ht 5\' 4"  (1.626 m)   Wt 93.4 kg (206 lb)   SpO2 99%   Breastfeeding? Unknown   BMI 35.36 kg/m                               I&O: Intake/Output      10/24 0701 - 10/25 0700 10/25 0701 - 10/26 0700   Other 1000    Total Intake(mL/kg) 1000 (10.7)    Urine (mL/kg/hr) 3240 (1.4) 100 (0.7)   Total Output 3240 100   Net -2240 -100                   Physical Exam:             Alert and Oriented X3  Abdomen: soft, non-tender, non-distended, active BS             Fundus: firm, non-tender, U-1             Dressing intact              Incision:  approximated with sutrure / no erythema / no ecchymosis / no drainage  Perineum: intact  Lochia: light  Extremities: trace edema, no calf pain or tenderness, negative Homans  A:        POD # 3 S/P CS            Urinary retention - resolved  P:        Routine postoperative care              Anticipate DC mid-day after measured voiding   Marlinda MikeBAILEY, Capri Veals CNM, MSN, [facility] 07/01/2017, 8:33 AM

## 2017-07-01 NOTE — Discharge Summary (Signed)
OB Discharge Summary  Patient Name: [name] DOB: 1985/10/16 MRN: [id number]  Date of admission: 06/28/2017  Admitting diagnosis: INDUCTION for polyhydramnios Intrauterine pregnancy: [redacted]w[redacted]d     Secondary diagnosis: LGA   Date of discharge: 07/01/2017    Discharge diagnosis: Term Pregnancy Delivered and POD 3 s/p cesarean section for breech presentation in labor / urinary retention post-op resolved      Prenatal history: [id number]   EDC : 07/05/2017, by Other Basis  Prenatal care at [facility]  Primary provider : Mody Prenatal course complicated by recurrent pregnancy loss / polyhydramnios / LGA  Prenatal Labs: ABO, Rh: --/--/A POS (10/22 0726)  Antibody: NEG (10/22 0726) Rubella: Immune (04/09 0000) RPR: Non Reactive (10/22 0744)  HBsAg: Negative (04/09 0000)  HIV: Non-reactive (04/09 0000)  GBS: Negative (09/25 0000)  GTT - nl                                   Hospital course:  Induction of Labor With Cesarean Section  31 y.o. yo [id number] at [redacted]w[redacted]d was admitted to the hospital 06/28/2017 for induction of labor. Patient had a labor course significant for spontaneous version of fetus to breech presentation in labor. The patient went for cesarean section due to Malpresentation, and delivered a Viable infant. Membrane Rupture Time/Date: 1:08 PM ,06/28/2017    Details of operation can be found in separate operative note.    Patient experience urinary retention POD 1 and 2 resulting in foley placement POD 2 for bladder rest - successful return to normal voiding POD 3 AM after foley removal. Initiated on prophylactic 5 day antibiotic course for risk UTI with (5) catheterizations during hospitalization. postpartum course.   She is ambulating, tolerating a regular diet, passing flatus, and urinating well today.   Patient is discharged home in stable condition on 07/01/17.                                    Delivering PROVIDER: MODY, VAISHALI                                                             Complications: urinary retention >24hr postop - resolved POD 3  Newborn Data: Live born female  Birth Weight: 7 lb 3.7 oz (3280 g) APGAR: 8, 9  Newborn Delivery   Birth date/time:  06/28/2017 21:24:00 Delivery type:  C-Section, Low Transverse  C-section categorization:  Primary     Baby Feeding: Breast Disposition:home with mother  Post partum procedures:none  Labs: Lab Results  Component Value Date   WBC 17.0 (H) 06/29/2017   HGB 12.7 06/29/2017   HCT 36.8 06/29/2017   MCV 86.2 06/29/2017   PLT 245 06/29/2017   No flowsheet data found.    Physical Exam @ time of discharge:  Vitals:   06/29/17 1822 06/30/17 0521 06/30/17 1859 07/01/17 0534  BP: (!) 116/55 117/61 (!) 104/52 110/66  Pulse: (!) 57 62 63 66  Resp: 18 18 18 20   Temp: 98.2 F (36.8 C) 98 F (36.7 C) 98.1 F (36.7 C) 97.6 F (36.4 C)  TempSrc: Axillary Oral  Oral Oral  SpO2:    99%  Weight:      Height:        General: alert, cooperative and no distress Lochia: appropriate Uterine Fundus: firm Perineum: intact Incision: Healing well with no significant drainage Extremities: DVT Evaluation: No evidence of DVT seen on physical exam.   Discharge instructions:  "Baby and Me Booklet" and [facility] Booklet  Discharge Medications:  Allergies as of 07/01/2017   No Known Allergies     Medication List    TAKE these medications   cephALEXin 500 MG capsule Commonly known as:  KEFLEX Take 1 capsule (500 mg total) by mouth every 12 (twelve) hours.   cetirizine 10 MG tablet Commonly known as:  ZYRTEC Take 10 mg by mouth daily.   fluticasone 50 MCG/ACT nasal spray Commonly known as:  FLONASE Place 1 spray into both nostrils daily.   ibuprofen 800 MG tablet Commonly known as:  ADVIL,MOTRIN Take 1 tablet (800 mg total) by mouth every 6 (six) hours.   oxyCODONE-acetaminophen 7.5-325 MG tablet Commonly known as:  PERCOCET Take 1 tablet by mouth  every 4 (four) hours as needed for severe pain.   prenatal multivitamin Tabs tablet Take 1 tablet by mouth daily at 12 noon.            Discharge Care Instructions        Start     Ordered   07/01/17 0000  Discharge wound care:    Comments:  Keep incision clean and dry - remove plastic honeycomb dressing Saturday Leave tape strips in place x 2 weeks then remove - dry after shower with hairdryer on low   07/01/17 1113      Diet: routine diet  Activity: Advance as tolerated. Pelvic rest x 6 weeks.   Follow up:6 weeks    Signed: Marlinda MikeBAILEY, Alice Burnside CNM, MSN, [facility] 07/01/2017, 11:13 AM

## 2017-07-02 LAB — URINE CULTURE: Culture: <10000 — AB

## 2019-02-28 ENCOUNTER — Other Ambulatory Visit: Payer: Self-pay | Admitting: Obstetrics & Gynecology

## 2019-03-09 ENCOUNTER — Other Ambulatory Visit: Payer: Self-pay

## 2019-03-09 ENCOUNTER — Encounter (HOSPITAL_COMMUNITY)
Admission: RE | Admit: 2019-03-09 | Discharge: 2019-03-09 | Disposition: A | Discharge status: Home / Self Care | Payer: Commercial Managed Care - PPO | Source: Ambulatory Visit | Attending: Obstetrics & Gynecology | Admitting: Obstetrics & Gynecology

## 2019-03-09 ENCOUNTER — Encounter (HOSPITAL_BASED_OUTPATIENT_CLINIC_OR_DEPARTMENT_OTHER): Payer: Self-pay | Admitting: *Deleted

## 2019-03-09 ENCOUNTER — Other Ambulatory Visit (HOSPITAL_COMMUNITY)
Admission: RE | Admit: 2019-03-09 | Discharge: 2019-03-09 | Disposition: A | Discharge status: Home / Self Care | Payer: Commercial Managed Care - PPO | Source: Ambulatory Visit | Attending: Obstetrics & Gynecology | Admitting: Obstetrics & Gynecology

## 2019-03-09 DIAGNOSIS — Z1159 Encounter for screening for other viral diseases: Secondary | ICD-10-CM | POA: Diagnosis not present

## 2019-03-09 DIAGNOSIS — Z01812 Encounter for preprocedural laboratory examination: Secondary | ICD-10-CM | POA: Diagnosis not present

## 2019-03-09 LAB — CBC
HCT: 37.3 % (ref 36.0–46.0)
Hemoglobin: 12.4 g/dL (ref 12.0–15.0)
MCH: 29.2 pg (ref 26.0–34.0)
MCHC: 33.2 g/dL (ref 30.0–36.0)
MCV: 87.8 fL (ref 80.0–100.0)
Platelets: 338 10*3/uL (ref 150–400)
RBC: 4.25 MIL/uL (ref 3.87–5.11)
RDW: 12.7 % (ref 11.5–15.5)
WBC: 7.8 10*3/uL (ref 4.0–10.5)
nRBC: 0 % (ref 0.0–0.2)

## 2019-03-09 LAB — SARS CORONAVIRUS 2 (TAT 6-24 HRS): SARS Coronavirus 2: NEGATIVE

## 2019-03-09 NOTE — Progress Notes (Signed)
Spoke w/ pt via phone for pre-op interview.  Npo after mn.  Arrive at 0900.  Needs urine preg. Cbc result dated 03-09-2019 in epic and chart.  Pt had covid test done today.

## 2019-03-09 NOTE — Progress Notes (Signed)

## 2019-03-13 ENCOUNTER — Ambulatory Visit (HOSPITAL_BASED_OUTPATIENT_CLINIC_OR_DEPARTMENT_OTHER): Payer: Commercial Managed Care - PPO | Admitting: Anesthesiology

## 2019-03-13 ENCOUNTER — Ambulatory Visit (HOSPITAL_BASED_OUTPATIENT_CLINIC_OR_DEPARTMENT_OTHER)
Admission: RE | Admit: 2019-03-13 | Discharge: 2019-03-13 | Disposition: A | Discharge status: Home / Self Care | Payer: Commercial Managed Care - PPO | Attending: Obstetrics & Gynecology | Admitting: Obstetrics & Gynecology

## 2019-03-13 ENCOUNTER — Encounter (HOSPITAL_BASED_OUTPATIENT_CLINIC_OR_DEPARTMENT_OTHER): Payer: Self-pay | Admitting: Anesthesiology

## 2019-03-13 ENCOUNTER — Encounter (HOSPITAL_BASED_OUTPATIENT_CLINIC_OR_DEPARTMENT_OTHER)
Admission: RE | Disposition: A | Discharge status: Home / Self Care | Payer: Self-pay | Source: Home / Self Care | Attending: Obstetrics & Gynecology

## 2019-03-13 DIAGNOSIS — Z302 Encounter for sterilization: Secondary | ICD-10-CM | POA: Diagnosis present

## 2019-03-13 HISTORY — PX: LAPAROSCOPIC TUBAL LIGATION: SHX1937

## 2019-03-13 HISTORY — DX: Allergic rhinitis, unspecified: J30.9

## 2019-03-13 HISTORY — DX: Presence of spectacles and contact lenses: Z97.3

## 2019-03-13 LAB — POCT PREGNANCY, URINE: Preg Test, Ur: NEGATIVE

## 2019-03-13 SURGERY — LIGATION, FALLOPIAN TUBE, LAPAROSCOPIC
Anesthesia: General | Site: Abdomen | Laterality: Bilateral

## 2019-03-13 MED ORDER — FENTANYL CITRATE (PF) 100 MCG/2ML IJ SOLN
25.0000 ug | INTRAMUSCULAR | Status: DC | PRN
  Filled 2019-03-13: qty 1

## 2019-03-13 MED ORDER — OXYCODONE-ACETAMINOPHEN 5-325 MG PO TABS
1.0000 | ORAL_TABLET | ORAL | Status: DC | PRN
  Administered 2019-03-13: 1 via ORAL
  Filled 2019-03-13: qty 1

## 2019-03-13 MED ORDER — IBUPROFEN 200 MG PO TABS: 600.0000 mg | ORAL_TABLET | Freq: Four times a day (QID) | ORAL | 0 refills | Status: AC | PRN

## 2019-03-13 MED ORDER — ACETAMINOPHEN 500 MG PO TABS: 1000.0000 mg | ORAL_TABLET | Freq: Three times a day (TID) | ORAL | 0 refills | Status: AC | PRN

## 2019-03-13 MED ORDER — PROPOFOL 10 MG/ML IV BOLUS
INTRAVENOUS | Status: AC
  Filled 2019-03-13: qty 20

## 2019-03-13 MED ORDER — OXYCODONE-ACETAMINOPHEN 5-325 MG PO TABS
ORAL_TABLET | ORAL | Status: AC
  Filled 2019-03-13: qty 1

## 2019-03-13 MED ORDER — SUCCINYLCHOLINE CHLORIDE 20 MG/ML IJ SOLN
INTRAMUSCULAR | Status: DC | PRN
  Administered 2019-03-13: 100 mg via INTRAVENOUS

## 2019-03-13 MED ORDER — OXYCODONE-ACETAMINOPHEN 5-325 MG PO TABS: 1.0000 | ORAL_TABLET | Freq: Four times a day (QID) | ORAL | 0 refills | Status: AC | PRN

## 2019-03-13 MED ORDER — LIDOCAINE 2% (20 MG/ML) 5 ML SYRINGE
INTRAMUSCULAR | Status: AC
  Filled 2019-03-13: qty 5

## 2019-03-13 MED ORDER — MIDAZOLAM HCL 5 MG/5ML IJ SOLN
INTRAMUSCULAR | Status: DC | PRN
  Administered 2019-03-13: 2 mg via INTRAVENOUS

## 2019-03-13 MED ORDER — FENTANYL CITRATE (PF) 100 MCG/2ML IJ SOLN
INTRAMUSCULAR | Status: AC
  Filled 2019-03-13: qty 2

## 2019-03-13 MED ORDER — PROMETHAZINE HCL 25 MG/ML IJ SOLN
6.2500 mg | INTRAMUSCULAR | Status: DC | PRN
  Filled 2019-03-13: qty 1

## 2019-03-13 MED ORDER — FENTANYL CITRATE (PF) 100 MCG/2ML IJ SOLN
INTRAMUSCULAR | Status: DC | PRN
  Administered 2019-03-13 (×2): 50 ug via INTRAVENOUS

## 2019-03-13 MED ORDER — LIDOCAINE HCL (CARDIAC) PF 100 MG/5ML IV SOSY
PREFILLED_SYRINGE | INTRAVENOUS | Status: DC | PRN
  Administered 2019-03-13: 60 mg via INTRAVENOUS
  Administered 2019-03-13: 30 mg via INTRAVENOUS

## 2019-03-13 MED ORDER — ROCURONIUM BROMIDE 10 MG/ML (PF) SYRINGE
PREFILLED_SYRINGE | INTRAVENOUS | Status: AC
  Filled 2019-03-13: qty 10

## 2019-03-13 MED ORDER — ONDANSETRON HCL 4 MG/2ML IJ SOLN
INTRAMUSCULAR | Status: AC
  Filled 2019-03-13: qty 2

## 2019-03-13 MED ORDER — MIDAZOLAM HCL 2 MG/2ML IJ SOLN
INTRAMUSCULAR | Status: AC
  Filled 2019-03-13: qty 2

## 2019-03-13 MED ORDER — ROCURONIUM BROMIDE 100 MG/10ML IV SOLN
INTRAVENOUS | Status: DC | PRN
  Administered 2019-03-13: 20 mg via INTRAVENOUS

## 2019-03-13 MED ORDER — DEXAMETHASONE SODIUM PHOSPHATE 10 MG/ML IJ SOLN
INTRAMUSCULAR | Status: DC | PRN
  Administered 2019-03-13: 4 mg via INTRAVENOUS

## 2019-03-13 MED ORDER — SUGAMMADEX SODIUM 200 MG/2ML IV SOLN
INTRAVENOUS | Status: DC | PRN
  Administered 2019-03-13: 100 mg via INTRAVENOUS

## 2019-03-13 MED ORDER — LACTATED RINGERS IV SOLN
INTRAVENOUS | Status: DC
  Administered 2019-03-13: 10:00:00 via INTRAVENOUS
  Filled 2019-03-13: qty 1000

## 2019-03-13 MED ORDER — ONDANSETRON HCL 4 MG/2ML IJ SOLN
INTRAMUSCULAR | Status: DC | PRN
  Administered 2019-03-13: 4 mg via INTRAVENOUS

## 2019-03-13 MED ORDER — PROPOFOL 10 MG/ML IV BOLUS
INTRAVENOUS | Status: DC | PRN
  Administered 2019-03-13: 50 mg via INTRAVENOUS
  Administered 2019-03-13: 150 mg via INTRAVENOUS

## 2019-03-13 MED ORDER — BUPIVACAINE HCL (PF) 0.25 % IJ SOLN
INTRAMUSCULAR | Status: DC | PRN
  Administered 2019-03-13: 10 mL

## 2019-03-13 MED ORDER — DEXAMETHASONE SODIUM PHOSPHATE 10 MG/ML IJ SOLN
INTRAMUSCULAR | Status: AC
  Filled 2019-03-13: qty 1

## 2019-03-13 SURGICAL SUPPLY — 30 items
CABLE HIGH FREQUENCY MONO STRZ (ELECTRODE) ×3 IMPLANT
COVER WAND RF STERILE (DRAPES) ×3 IMPLANT
DERMABOND ADVANCED (GAUZE/BANDAGES/DRESSINGS) ×2
DERMABOND ADVANCED .7 DNX12 (GAUZE/BANDAGES/DRESSINGS) ×1 IMPLANT
DRSG OPSITE POSTOP 3X4 (GAUZE/BANDAGES/DRESSINGS) IMPLANT
DURAPREP 26ML APPLICATOR (WOUND CARE) ×3 IMPLANT
FILTER SMOKE EVAC LAPAROSHD (FILTER) IMPLANT
GAUZE 4X4 16PLY RFD (DISPOSABLE) ×3 IMPLANT
GLOVE BIO SURGEON STRL SZ7 (GLOVE) ×3 IMPLANT
GLOVE BIOGEL PI IND STRL 7.0 (GLOVE) ×2 IMPLANT
GLOVE BIOGEL PI INDICATOR 7.0 (GLOVE) ×4
GOWN STRL REUS W/TWL LRG LVL3 (GOWN DISPOSABLE) ×9 IMPLANT
LIGASURE VESSEL 5MM BLUNT TIP (ELECTROSURGICAL) ×3 IMPLANT
NS IRRIG 1000ML POUR BTL (IV SOLUTION) ×3 IMPLANT
PACK LAPAROSCOPY BASIN (CUSTOM PROCEDURE TRAY) ×3 IMPLANT
PACK TRENDGUARD 450 HYBRID PRO (MISCELLANEOUS) IMPLANT
POUCH SPECIMEN RETRIEVAL 10MM (ENDOMECHANICALS) IMPLANT
PROTECTOR NERVE ULNAR (MISCELLANEOUS) ×6 IMPLANT
SCISSORS LAP 5X35 DISP (ENDOMECHANICALS) IMPLANT
SET IRRIG TUBING LAPAROSCOPIC (IRRIGATION / IRRIGATOR) IMPLANT
SOLUTION ELECTROLUBE (MISCELLANEOUS) IMPLANT
SUT VICRYL 0 UR6 27IN ABS (SUTURE) ×3 IMPLANT
SUT VICRYL 4-0 PS2 18IN ABS (SUTURE) ×3 IMPLANT
TOWEL OR 17X26 10 PK STRL BLUE (TOWEL DISPOSABLE) ×6 IMPLANT
TRAY FOLEY W/BAG SLVR 14FR (SET/KITS/TRAYS/PACK) ×3 IMPLANT
TRENDGUARD 450 HYBRID PRO PACK (MISCELLANEOUS)
TROCAR BALLN 12MMX100 BLUNT (TROCAR) ×3 IMPLANT
TROCAR XCEL NON-BLD 5MMX100MML (ENDOMECHANICALS) IMPLANT
TUBING EVAC SMOKE HEATED PNEUM (TUBING) ×3 IMPLANT
WARMER LAPAROSCOPE (MISCELLANEOUS) ×3 IMPLANT

## 2019-03-13 NOTE — Anesthesia Preprocedure Evaluation (Signed)
Anesthesia Evaluation  Patient identified by MRN, date of birth, ID band Patient awake    Reviewed: Allergy & Precautions, NPO status , Patient's Chart, lab work & pertinent test results  Airway Mallampati: II  TM Distance: >3 FB     Dental   Pulmonary neg pulmonary ROS,    breath sounds clear to auscultation       Cardiovascular negative cardio ROS   Rhythm:Regular Rate:Normal     Neuro/Psych negative neurological ROS     GI/Hepatic negative GI ROS, Neg liver ROS,   Endo/Other  negative endocrine ROS  Renal/GU negative Renal ROS     Musculoskeletal   Abdominal   Peds  Hematology negative hematology ROS (+)   Anesthesia Other Findings   Reproductive/Obstetrics                             Anesthesia Physical Anesthesia Plan  ASA: I  Anesthesia Plan: General   Post-op Pain Management:    Induction: Intravenous  PONV Risk Score and Plan: 3 and Dexamethasone, Ondansetron, Midazolam and Treatment may vary due to age or medical condition  Airway Management Planned: Oral ETT  Additional Equipment:   Intra-op Plan:   Post-operative Plan: Extubation in OR  Informed Consent: I have reviewed the patients History and Physical, chart, labs and discussed the procedure including the risks, benefits and alternatives for the proposed anesthesia with the patient or authorized representative who has indicated his/her understanding and acceptance.     Dental advisory given  Plan Discussed with: CRNA  Anesthesia Plan Comments:         Anesthesia Quick Evaluation

## 2019-03-13 NOTE — H&P (Addendum)
[name] is an 33 y.o. female [phone]. Here for permanent sterilization surgery. 2 kids, one SVD, one emerg C/section. 2 SABs. Married. Nl Paps. No breast issues. Regular menses. Declined other forms of BC  Patient's last menstrual period was 02/22/2019 (exact date).    Past Medical History:  Diagnosis Date  . Rhinitis, allergic   . Wears contact lenses     Past Surgical History:  Procedure Laterality Date  . CESAREAN SECTION N/A 06/28/2017   Procedure: CESAREAN SECTION;  Surgeon: Azucena Fallen, MD;  Location: [facility];  Service: Obstetrics;  Laterality: N/A;  . DILATION AND EVACUATION N/A 09/14/2014   Procedure: DILATATION AND EVACUATION;  Surgeon: Lovenia Kim, MD;  Location: [facility];  Service: Gynecology;  Laterality: N/A;  . DILATION AND EVACUATION N/A 02/19/2015   Procedure: DILATATION AND EVACUATION With Tissues Sent For Chromosome Analysis;  Surgeon: Brien Few, MD;  Location: [facility];  Service: Gynecology;  Laterality: N/A;  . MOUTH SURGERY    . TONSILLECTOMY      History reviewed. No pertinent family history.  Social History:  reports that she has never smoked. She has never used smokeless tobacco. She reports that she does not drink alcohol or use drugs.  Allergies: No Known Allergies  No medications prior to admission.    ROS neg  Height 5\' 4"  (1.626 m), weight 83.9 kg, last menstrual period 02/22/2019, not currently breastfeeding. Physical Exam Physical exam:  A&O x 3, no acute distress. Pleasant HEENT neg, no thyromegaly Lungs CTA bilat CV RRR, S1S2 normal Abdo soft, non tender, non acute Extr no edema/ tenderness Pelvic Uterus, cervix and adnexa normal   Assessment/Plan: 33 yo female requesting permanent sterilization by laparoscopic tubal sterilization, planning bilateral salpingectomy. Reviewed surgery, risks/ complications incl infection, bleeding, damage to internal organs including bladder, bowels, ureters, blood vessels, other  risks from anesthesia, VTE and delayed complications of any surgery, complications in future surgery reviewed.  Also reviewed irreversibility, risk of regret and small chance of ectopic pregnancy with failure. Salpingectomy benefit of ovarian cancer risk reduction reviewed.  She gives informed written consent.   [name] 03/13/2019, 6:03 AM

## 2019-03-13 NOTE — Anesthesia Postprocedure Evaluation (Signed)
Anesthesia Post Note  Patient: [name]  Procedure(s) Performed: LAPAROSCOPIC TUBAL LIGATION by Salpingectomy (Bilateral Abdomen)     Patient location during evaluation: PACU Anesthesia Type: General Level of consciousness: awake and alert Pain management: pain level controlled Vital Signs Assessment: post-procedure vital signs reviewed and stable Respiratory status: spontaneous breathing, nonlabored ventilation, respiratory function stable and patient connected to nasal cannula oxygen Cardiovascular status: blood pressure returned to baseline and stable Postop Assessment: no apparent nausea or vomiting Anesthetic complications: no    Last Vitals:  Vitals:   03/13/19 1230 03/13/19 1245  BP: 118/68 120/61  Pulse: (!) 53 (!) 58  Resp: 16 19  Temp:    SpO2: 100% 100%    Last Pain:  Vitals:   03/13/19 1230  TempSrc:   PainSc: 4                  Tiajuana Amass

## 2019-03-13 NOTE — Anesthesia Procedure Notes (Signed)
Procedure Name: Intubation Date/Time: 03/13/2019 11:06 AM Performed by: Bonney Aid, CRNA Pre-anesthesia Checklist: Patient identified, Emergency Drugs available, Suction available and Patient being monitored Patient Re-evaluated:Patient Re-evaluated prior to induction Oxygen Delivery Method: Circle system utilized Preoxygenation: Pre-oxygenation with 100% oxygen Induction Type: IV induction Ventilation: Mask ventilation without difficulty Laryngoscope Size: Mac and 3 Grade View: Grade I Tube type: Oral Tube size: 7.0 mm Number of attempts: 1 Airway Equipment and Method: Stylet and Oral airway Placement Confirmation: ETT inserted through vocal cords under direct vision,  positive ETCO2 and breath sounds checked- equal and bilateral Secured at: 21 cm Tube secured with: Tape Dental Injury: Teeth and Oropharynx as per pre-operative assessment

## 2019-03-13 NOTE — Discharge Instructions (Addendum)
[id number] Tubal Ligation, Care After This sheet gives you information about how to care for yourself after your procedure. Your health care provider may also give you more specific instructions. If you have problems or questions, contact your health care provider. What can I expect after the procedure? After the procedure, it is common to have:  A sore throat.  Discomfort in your shoulder.  Mild discomfort or cramping in your abdomen.  Gas pains.  Pain or soreness in the area where the surgical incision was made.  A bloated feeling.  Tiredness.  Nausea.  Vomiting. Follow these instructions at home: Medicines  Take over-the-counter and prescription medicines only as told by your health care provider.  Do not take aspirin because it can cause bleeding.  Ask your health care provider if the medicine prescribed to you: ? Requires you to avoid driving or using heavy machinery. ? Can cause constipation. You may need to take actions to prevent or treat constipation, such as:  Drink enough fluid to keep your urine pale yellow.  Take over-the-counter or prescription medicines.  Eat foods that are high in fiber, such as beans, whole grains, and fresh fruits and vegetables.  Limit foods that are high in fat and processed sugars, such as fried or sweet foods. Incision care      Follow instructions from your health care provider about how to take care of your incision. Make sure you: ? Wash your hands with soap and water before and after you change your bandage (dressing). If soap and water are not available, use hand sanitizer. ? Change your dressing as told by your health care provider. ? Leave stitches (sutures), skin glue, or adhesive strips in place. These skin closures may need to stay in place for 2 weeks or longer. If adhesive strip edges start to loosen and curl up, you may trim the loose edges. Do not remove adhesive strips completely unless your health care  provider tells you to do that.  Check your incision area every day for signs of infection. Check for: ? Redness, swelling, or pain. ? Fluid or blood. ? Warmth. ? Pus or a bad smell. Activity  Rest as told by your health care provider.  Avoid sitting for a long time without moving. Get up to take short walks every 1-2 hours. This is important to improve blood flow and breathing. Ask for help if you feel weak or unsteady.  Return to your normal activities as told by your health care provider. Ask your health care provider what activities are safe for you. General instructions  Do not take baths, swim, or use a hot tub until your health care provider approves. Ask your health care provider if you may take showers. You may only be allowed to take sponge baths.  Have someone help you with your daily household tasks for the first few days.  Keep all follow-up visits as told by your health care provider. This is important. Contact a health care provider if:  You have redness, swelling, or pain around your incision.  Your incision feels warm to the touch.  You have pus or a bad smell coming from your incision.  The edges of your incision break open after the sutures have been removed.  Your pain does not improve after 2-3 days.  You have a rash.  You repeatedly become dizzy or light-headed.  Your pain medicine is not helping. Get help right away if you:  Have a fever.  Faint.  Have increasing  pain in your abdomen.  Have severe pain in one or both of your shoulders.  Have fluid or blood coming from your sutures or from your vagina.  Have shortness of breath or difficulty breathing.  Have chest pain or leg pain.  Have ongoing nausea, vomiting, or diarrhea. Summary  After the procedure, it is common to have mild discomfort or cramping in your abdomen.  Take over-the-counter and prescription medicines only as told by your health care provider.  Watch for symptoms that  should prompt you to call your health care provider.  Keep all follow-up visits as told by your health care provider. This is important. This information is not intended to replace advice given to you by your health care provider. Make sure you discuss any questions you have with your health care provider. Document Released: 03/13/2005 Document Revised: 07/19/2018 Document Reviewed: 07/19/2018 Elsevier Patient Education  2020 [employer]   Post Anesthesia Home Care Instructions  Activity: Get plenty of rest for the remainder of the day. A responsible individual must stay with you for 24 hours following the procedure.  For the next 24 hours, DO NOT: -Drive a car -Advertising copywriterperate machinery -Drink alcoholic beverages -Take any medication unless instructed by your physician -Make any legal decisions or sign important papers.  Meals: Start with liquid foods such as gelatin or soup. Progress to regular foods as tolerated. Avoid greasy, spicy, heavy foods. If nausea and/or vomiting occur, drink only clear liquids until the nausea and/or vomiting subsides. Call your physician if vomiting continues.  Special Instructions/Symptoms: Your throat may feel dry or sore from the anesthesia or the breathing tube placed in your throat during surgery. If this causes discomfort, gargle with warm salt water. The discomfort should disappear within 24 hours.  If you had a scopolamine patch placed behind your ear for the management of post- operative nausea and/or vomiting:  1. The medication in the patch is effective for 72 hours, after which it should be removed.  Wrap patch in a tissue and discard in the trash. Wash hands thoroughly with soap and water. 2. You may remove the patch earlier than 72 hours if you experience unpleasant side effects which may include dry mouth, dizziness or visual disturbances. 3. Avoid touching the patch. Wash your hands with soap and water after contact with the patch.

## 2019-03-13 NOTE — Op Note (Signed)
Preoperative diagnosis: Multiparity, permanent sterilization desired Postoperative diagnosis: Same Procedure: Laparoscopic bilateral tubal sterilization by bilateral salpingectomy Surgeon: Dr Azucena Fallen, MD Assistants: none Anesthesia Gen. Endotracheal IV fluids LR 700 cc EBL minimal Urine clear -voided before going to OR Complications none Disposition PACU Specimens: bilateral fallopian tubes   Procedure Patient is 33 yo, G4 P2022, who desired permanent sterilization via tubal ligation/ salpingectomy. All options of contraception and sterilization were reviewed including female partner vasectomy. Patient declined other options. Risk and complications of surgery including infection, bleeding, damage to internal organs, other complications including pneumonia, VTE were reviewed. Also discussed irreversibility as well as failure and risk of ectopic pregnancy. Patient voiced understanding. Informed written consent was obtained.  Patient was brought to the operating room with IV running. Timeout was carried out. She underwent general anesthesia without difficulty and was given dorsal lithotomy. Examination under anesthesia revealed normal size anteverted uterus, normal cervix and adnexa.  Parts prepped and draped in standard fashion. Speculum was placed anterior lip of cervix was grasped with tenaculum and Hulka manipulator was introduced and secured to cervix, tenaculum removed.   Gloves were changed attention was focused on the abdomen. A 10 mm vertical incision was made at the umbilicus after injecting 5 cc Marcaine. Incision was carried down to the fascia was incised peritoneal entry was confirmed. Stay suture of 0 Vicryl was taken on the fascia and Hassan cannula was introduced. Insufflation was begun with CO2. Patient was given Trendelenburg position. A 0 laparoscope was introduced.  No entry site injury noted. Five mm incisions made in right and left lower quadrants after injecting marcaine  and transilluminating the entry site. 5 mm trocars inserted under vision. Ligasure used for bilateral complete salpingectomy by desiccating and cutting mesosalpinx while Million dollar grasper used to grasp and elevated the tubes. Tubes removed through central port under vision. Hemostasis noted. All instruments and ports removed. Pneumoperitoneum was released. Fascial incision of umbilical port closed with stay suture. The skin was approximated using 4-0 Vicryl in subcuticular fashion. Dermabond was applied at all three incisions. Hulka manipulator was removed from the uterus. Hemostasis was excellent. All counts were correct x2  Patient brought to PACU extubated and in stable condition.  Surgical findings were discussed with patient's husband. Discharge home, followup with Dr. Benjie Karvonen in office in 2 weeks.  I performed the surgery. Dr Azucena Fallen

## 2019-03-13 NOTE — Transfer of Care (Signed)
Immediate Anesthesia Transfer of Care Note  Patient: [name]  Procedure(s) Performed: LAPAROSCOPIC TUBAL LIGATION by Salpingectomy (Bilateral Abdomen)  Patient Location: PACU  Anesthesia Type:General  Level of Consciousness: awake, alert , oriented and patient cooperative  Airway & Oxygen Therapy: Patient Spontanous Breathing and Patient connected to nasal cannula oxygen  Post-op Assessment: Report given to RN and Post -op Vital signs reviewed and stable  Post vital signs: Reviewed  Last Vitals:  Vitals Value Taken Time  BP 119/66 03/13/19 1207  Temp    Pulse 64 03/13/19 1209  Resp 18 03/13/19 1209  SpO2 99 % 03/13/19 1209  Vitals shown include unvalidated device data.  Last Pain:  Vitals:   03/13/19 0923  TempSrc: Oral  PainSc: 0-No pain      Patients Stated Pain Goal: 5 (19/37/90 2409)  Complications: No apparent anesthesia complications

## 2019-03-14 ENCOUNTER — Encounter (HOSPITAL_BASED_OUTPATIENT_CLINIC_OR_DEPARTMENT_OTHER): Payer: Self-pay | Admitting: Obstetrics & Gynecology
